# Patient Record
Sex: Female | Born: 1990 | Hispanic: Yes | Marital: Married | Smoking: Never smoker
Health system: Southern US, Community
[De-identification: ages and names within clinical notes are randomized; demographics above are authoritative.]

## PROBLEM LIST (undated history)

## (undated) ENCOUNTER — Emergency Department (HOSPITAL_COMMUNITY): Payer: BLUE CROSS/BLUE SHIELD

## (undated) DIAGNOSIS — Z789 Other specified health status: Secondary | ICD-10-CM

## (undated) HISTORY — PX: NO PAST SURGERIES: SHX2092

---

## 2021-05-23 ENCOUNTER — Encounter (HOSPITAL_COMMUNITY): Payer: Self-pay | Admitting: Obstetrics & Gynecology

## 2021-05-23 ENCOUNTER — Emergency Department (HOSPITAL_COMMUNITY): Payer: BLUE CROSS/BLUE SHIELD

## 2021-05-23 ENCOUNTER — Emergency Department (HOSPITAL_COMMUNITY): Payer: BLUE CROSS/BLUE SHIELD | Admitting: Certified Registered Nurse Anesthetist

## 2021-05-23 ENCOUNTER — Other Ambulatory Visit: Payer: Self-pay

## 2021-05-23 ENCOUNTER — Encounter (HOSPITAL_COMMUNITY): Admission: EM | Disposition: A | Discharge status: Home / Self Care | Payer: Self-pay | Source: Home / Self Care

## 2021-05-23 ENCOUNTER — Ambulatory Visit (HOSPITAL_COMMUNITY)
Admission: EM | Admit: 2021-05-23 | Discharge: 2021-05-23 | Disposition: A | Discharge status: Home / Self Care | Payer: BLUE CROSS/BLUE SHIELD | Attending: Obstetrics & Gynecology | Admitting: Obstetrics & Gynecology

## 2021-05-23 DIAGNOSIS — K661 Hemoperitoneum: Secondary | ICD-10-CM | POA: Diagnosis not present

## 2021-05-23 DIAGNOSIS — Z3A01 Less than 8 weeks gestation of pregnancy: Secondary | ICD-10-CM

## 2021-05-23 DIAGNOSIS — O00101 Right tubal pregnancy without intrauterine pregnancy: Secondary | ICD-10-CM | POA: Insufficient documentation

## 2021-05-23 HISTORY — DX: Other specified health status: Z78.9

## 2021-05-23 HISTORY — PX: LAPAROSCOPIC UNILATERAL SALPINGECTOMY: SHX5934

## 2021-05-23 LAB — URINALYSIS, ROUTINE W REFLEX MICROSCOPIC
Bilirubin Urine: NEGATIVE
Glucose, UA: NEGATIVE mg/dL
Ketones, ur: NEGATIVE mg/dL
Nitrite: NEGATIVE
Protein, ur: 100 mg/dL — AB
Specific Gravity, Urine: 1.024 (ref 1.005–1.030)
pH: 5 (ref 5.0–8.0)

## 2021-05-23 LAB — COMPREHENSIVE METABOLIC PANEL
ALT: 13 U/L (ref 0–44)
AST: 17 U/L (ref 15–41)
Albumin: 4.4 g/dL (ref 3.5–5.0)
Alkaline Phosphatase: 63 U/L (ref 38–126)
Anion gap: 9 (ref 5–15)
BUN: 13 mg/dL (ref 6–20)
CO2: 21 mmol/L — ABNORMAL LOW (ref 22–32)
Calcium: 9.8 mg/dL (ref 8.9–10.3)
Chloride: 107 mmol/L (ref 98–111)
Creatinine, Ser: 0.68 mg/dL (ref 0.44–1.00)
GFR, Estimated: >60 mL/min (ref >60)
Glucose, Bld: 114 mg/dL — ABNORMAL HIGH (ref 70–99)
Potassium: 4 mmol/L (ref 3.5–5.1)
Sodium: 137 mmol/L (ref 135–145)
Total Bilirubin: 0.8 mg/dL (ref 0.3–1.2)
Total Protein: 8.4 g/dL — ABNORMAL HIGH (ref 6.5–8.1)

## 2021-05-23 LAB — WET PREP, GENITAL
Clue Cells Wet Prep HPF POC: NONE SEEN
Sperm: NONE SEEN
Trich, Wet Prep: NONE SEEN
WBC, Wet Prep HPF POC: <10 (ref <10)
Yeast Wet Prep HPF POC: NONE SEEN

## 2021-05-23 LAB — URINALYSIS, MICROSCOPIC (REFLEX)
RBC / HPF: >50 RBC/hpf (ref 0–5)
WBC, UA: >50 WBC/hpf (ref 0–5)

## 2021-05-23 LAB — CBC
HCT: 39.7 % (ref 36.0–46.0)
Hemoglobin: 13.1 g/dL (ref 12.0–15.0)
MCH: 30.6 pg (ref 26.0–34.0)
MCHC: 33 g/dL (ref 30.0–36.0)
MCV: 92.8 fL (ref 80.0–100.0)
Platelets: 360 10*3/uL (ref 150–400)
RBC: 4.28 MIL/uL (ref 3.87–5.11)
RDW: 13.6 % (ref 11.5–15.5)
WBC: 11.3 10*3/uL — ABNORMAL HIGH (ref 4.0–10.5)
nRBC: 0 % (ref 0.0–0.2)

## 2021-05-23 LAB — POCT PREGNANCY, URINE: Preg Test, Ur: POSITIVE — AB

## 2021-05-23 LAB — HCG, QUANTITATIVE, PREGNANCY: hCG, Beta Chain, Quant, S: 6603 m[IU]/mL — ABNORMAL HIGH (ref <5)

## 2021-05-23 SURGERY — SALPINGECTOMY, UNILATERAL, LAPAROSCOPIC
Anesthesia: General | Laterality: Right

## 2021-05-23 MED ORDER — LIDOCAINE 2% (20 MG/ML) 5 ML SYRINGE
INTRAMUSCULAR | Status: AC
  Filled 2021-05-23: qty 5

## 2021-05-23 MED ORDER — ROCURONIUM BROMIDE 100 MG/10ML IV SOLN
INTRAVENOUS | Status: DC | PRN
  Administered 2021-05-23: 50 mg via INTRAVENOUS
  Administered 2021-05-23: 20 mg via INTRAVENOUS

## 2021-05-23 MED ORDER — LACTATED RINGERS IV SOLN: INTRAVENOUS | Status: DC | PRN

## 2021-05-23 MED ORDER — DEXAMETHASONE SODIUM PHOSPHATE 10 MG/ML IJ SOLN
INTRAMUSCULAR | Status: DC | PRN
  Administered 2021-05-23: 10 mg via INTRAVENOUS

## 2021-05-23 MED ORDER — SODIUM CHLORIDE 0.9 % IR SOLN
Status: DC | PRN
  Administered 2021-05-23: 3000 mL

## 2021-05-23 MED ORDER — PROPOFOL 10 MG/ML IV BOLUS
INTRAVENOUS | Status: DC | PRN
  Administered 2021-05-23: 150 mg via INTRAVENOUS

## 2021-05-23 MED ORDER — SUGAMMADEX SODIUM 200 MG/2ML IV SOLN
INTRAVENOUS | Status: DC | PRN
Start: 2021-05-23 — End: 2021-05-23
  Administered 2021-05-23: 200 mg via INTRAVENOUS

## 2021-05-23 MED ORDER — FENTANYL CITRATE (PF) 250 MCG/5ML IJ SOLN
INTRAMUSCULAR | Status: AC
  Filled 2021-05-23: qty 5

## 2021-05-23 MED ORDER — SUCCINYLCHOLINE CHLORIDE 200 MG/10ML IV SOSY
PREFILLED_SYRINGE | INTRAVENOUS | Status: AC
  Filled 2021-05-23: qty 10

## 2021-05-23 MED ORDER — PROPOFOL 10 MG/ML IV BOLUS
INTRAVENOUS | Status: AC
  Filled 2021-05-23: qty 20

## 2021-05-23 MED ORDER — PHENYLEPHRINE HCL (PRESSORS) 10 MG/ML IV SOLN
INTRAVENOUS | Status: DC | PRN
  Administered 2021-05-23 (×2): 80 ug via INTRAVENOUS

## 2021-05-23 MED ORDER — OXYCODONE-ACETAMINOPHEN 5-325 MG PO TABS: 1.0000 | ORAL_TABLET | Freq: Four times a day (QID) | ORAL | 0 refills | Status: DC | PRN

## 2021-05-23 MED ORDER — FENTANYL CITRATE (PF) 100 MCG/2ML IJ SOLN
INTRAMUSCULAR | Status: DC | PRN
  Administered 2021-05-23: 50 ug via INTRAVENOUS
  Administered 2021-05-23: 100 ug via INTRAVENOUS
  Administered 2021-05-23: 50 ug via INTRAVENOUS

## 2021-05-23 MED ORDER — ONDANSETRON HCL 4 MG/2ML IJ SOLN
INTRAMUSCULAR | Status: AC
  Filled 2021-05-23: qty 2

## 2021-05-23 MED ORDER — DEXAMETHASONE SODIUM PHOSPHATE 10 MG/ML IJ SOLN
INTRAMUSCULAR | Status: AC
  Filled 2021-05-23: qty 1

## 2021-05-23 MED ORDER — BUPIVACAINE HCL (PF) 0.25 % IJ SOLN
INTRAMUSCULAR | Status: AC
Start: 2021-05-23 — End: ?
  Filled 2021-05-23: qty 30

## 2021-05-23 MED ORDER — FENTANYL CITRATE (PF) 100 MCG/2ML IJ SOLN
25.0000 ug | INTRAMUSCULAR | Status: DC | PRN
  Administered 2021-05-23: 25 ug via INTRAVENOUS

## 2021-05-23 MED ORDER — MIDAZOLAM HCL 2 MG/2ML IJ SOLN
INTRAMUSCULAR | Status: AC
  Filled 2021-05-23: qty 2

## 2021-05-23 MED ORDER — ONDANSETRON HCL 4 MG/2ML IJ SOLN
INTRAMUSCULAR | Status: DC | PRN
  Administered 2021-05-23: 4 mg via INTRAVENOUS

## 2021-05-23 MED ORDER — FENTANYL CITRATE (PF) 100 MCG/2ML IJ SOLN
INTRAMUSCULAR | Status: AC
  Filled 2021-05-23: qty 2

## 2021-05-23 MED ORDER — SUCCINYLCHOLINE CHLORIDE 200 MG/10ML IV SOSY
PREFILLED_SYRINGE | INTRAVENOUS | Status: DC | PRN
  Administered 2021-05-23: 140 mg via INTRAVENOUS

## 2021-05-23 MED ORDER — ROCURONIUM BROMIDE 10 MG/ML (PF) SYRINGE
PREFILLED_SYRINGE | INTRAVENOUS | Status: AC
  Filled 2021-05-23: qty 10

## 2021-05-23 MED ORDER — HYDROMORPHONE HCL 1 MG/ML IJ SOLN
1.0000 mg | INTRAMUSCULAR | Status: DC | PRN
  Administered 2021-05-23: 1 mg via INTRAVENOUS
  Filled 2021-05-23: qty 1

## 2021-05-23 MED ORDER — MIDAZOLAM HCL 5 MG/5ML IJ SOLN
INTRAMUSCULAR | Status: DC | PRN
  Administered 2021-05-23: 2 mg via INTRAVENOUS

## 2021-05-23 MED ORDER — LIDOCAINE HCL (CARDIAC) PF 100 MG/5ML IV SOSY
PREFILLED_SYRINGE | INTRAVENOUS | Status: DC | PRN
Start: 2021-05-23 — End: 2021-05-23
  Administered 2021-05-23: 80 mg via INTRAVENOUS

## 2021-05-23 MED ORDER — BUPIVACAINE HCL (PF) 0.25 % IJ SOLN
INTRAMUSCULAR | Status: DC | PRN
  Administered 2021-05-23: 13 mL

## 2021-05-23 MED ORDER — IBUPROFEN 600 MG PO TABS: 600.0000 mg | ORAL_TABLET | Freq: Four times a day (QID) | ORAL | 3 refills | Status: DC | PRN

## 2021-05-23 SURGICAL SUPPLY — 32 items
CABLE HIGH FREQUENCY MONO STRZ (ELECTRODE) IMPLANT
DERMABOND ADVANCED (GAUZE/BANDAGES/DRESSINGS) ×1
DERMABOND ADVANCED .7 DNX12 (GAUZE/BANDAGES/DRESSINGS) ×1 IMPLANT
DURAPREP 26ML APPLICATOR (WOUND CARE) ×3 IMPLANT
GLOVE BIO SURGEON STRL SZ 6.5 (GLOVE) ×3 IMPLANT
GLOVE BIOGEL PI IND STRL 7.0 (GLOVE) ×8 IMPLANT
GLOVE BIOGEL PI INDICATOR 7.0 (GLOVE) ×4
GOWN STRL REUS W/ TWL LRG LVL3 (GOWN DISPOSABLE) ×4 IMPLANT
GOWN STRL REUS W/TWL LRG LVL3 (GOWN DISPOSABLE) ×2
IRRIG SUCT STRYKERFLOW 2 WTIP (MISCELLANEOUS) ×3
IRRIGATION SUCT STRKRFLW 2 WTP (MISCELLANEOUS) ×1 IMPLANT
KIT TURNOVER KIT B (KITS) ×3 IMPLANT
NDL INSUFFLATION 14GA 120MM (NEEDLE) ×1 IMPLANT
NEEDLE INSUFFLATION 14GA 120MM (NEEDLE) ×3 IMPLANT
NS IRRIG 1000ML POUR BTL (IV SOLUTION) ×3 IMPLANT
PACK LAPAROSCOPY BASIN (CUSTOM PROCEDURE TRAY) ×3 IMPLANT
PACK TRENDGUARD 450 HYBRID PRO (MISCELLANEOUS) ×1 IMPLANT
POUCH SPECIMEN RETRIEVAL 10MM (ENDOMECHANICALS) ×2 IMPLANT
PROTECTOR NERVE ULNAR (MISCELLANEOUS) ×6 IMPLANT
SET TUBE SMOKE EVAC HIGH FLOW (TUBING) ×3 IMPLANT
SHEARS HARMONIC ACE PLUS 36CM (ENDOMECHANICALS) ×2 IMPLANT
SLEEVE ENDOPATH XCEL 5M (ENDOMECHANICALS) ×3 IMPLANT
STRIP CLOSURE SKIN 1/2X4 (GAUZE/BANDAGES/DRESSINGS) IMPLANT
SUT VICRYL 0 UR6 27IN ABS (SUTURE) ×3 IMPLANT
SUT VICRYL 4-0 PS2 18IN ABS (SUTURE) ×3 IMPLANT
TOWEL GREEN STERILE FF (TOWEL DISPOSABLE) ×6 IMPLANT
TRAY FOLEY W/BAG SLVR 14FR (SET/KITS/TRAYS/PACK) ×3 IMPLANT
TRENDGUARD 450 HYBRID PRO PACK (MISCELLANEOUS) ×3
TROCAR XCEL DIL TIP R 11M (ENDOMECHANICALS) ×3 IMPLANT
TROCAR XCEL NON-BLD 11X100MML (ENDOMECHANICALS) ×3 IMPLANT
TROCAR XCEL NON-BLD 5MMX100MML (ENDOMECHANICALS) ×3 IMPLANT
WARMER LAPAROSCOPE (MISCELLANEOUS) ×3 IMPLANT

## 2021-05-23 NOTE — Anesthesia Postprocedure Evaluation (Signed)
Anesthesia Post Note ? ?Patient: Teresa Mckinney ? ?Procedure(s) Performed: LAPAROSCOPIC RIGHT SALPINGECTOMY WITH REMOVAL OF ECTOPIC PREGNANCY (Right) ? ?  ? ?Patient location during evaluation: PACU ?Anesthesia Type: General ?Level of consciousness: awake and alert ?Pain management: pain level controlled ?Vital Signs Assessment: post-procedure vital signs reviewed and stable ?Respiratory status: spontaneous breathing, nonlabored ventilation, respiratory function stable and patient connected to nasal cannula oxygen ?Cardiovascular status: blood pressure returned to baseline and stable ?Postop Assessment: no apparent nausea or vomiting ?Anesthetic complications: no ? ? ?No notable events documented. ? ?Last Vitals:  ?Vitals:  ? 05/23/21 0745 05/23/21 0800  ?BP: 107/67 117/85  ?Pulse: 74 81  ?Resp: 12 14  ?Temp:  (!) 36.4 ?C  ?SpO2: 100% 99%  ?  ?Last Pain:  ?Vitals:  ? 05/23/21 0800  ?TempSrc:   ?PainSc: 6   ? ? ?  ?  ?  ?  ?  ?  ? ?March Rummage Carlotta Telfair ? ? ? ? ?

## 2021-05-23 NOTE — Progress Notes (Signed)
Wasted Fentanyl IV. Witnessed by Chaney Malling, RN. ?

## 2021-05-23 NOTE — Op Note (Signed)
Teresa Mckinney ?PROCEDURE DATE: 05/23/2021 ? ?PREOPERATIVE DIAGNOSIS: Ruptured ectopic pregnancy ?POSTOPERATIVE DIAGNOSIS: Ruptured right fallopian tube ectopic pregnancy ?PROCEDURE: Laparoscopic right salpingectomy and removal of ectopic pregnancy ?SURGEON:  Adam Phenix, MD ?ANESTHESIOLOGIST: No responsible provider has been recorded for the case. Anesthesiologist: Dorris Singh, MD; Nance Pew Nelle Don, DO ?CRNA: Reine Just, CRNA; Dorie Rank, CRNA ? ?INDICATIONS: 31 y.o. G3P2002 at [redacted]w[redacted]d here with the preoperative diagnoses as listed above.  Please refer to preoperative notes for more details. Patient was counseled regarding need for laparoscopic salpingectomy. Risks of surgery including bleeding which may require transfusion or reoperation, infection, injury to bowel or other surrounding organs, need for additional procedures including laparotomy and other postoperative/anesthesia complications were explained to patient.  Written informed consent was obtained. ? ?FINDINGS:  moderate amount of hemoperitoneum estimated to be about 100 ml of blood and clots.  Dilated right fallopian tube containing ectopic gestation. Small normal appearing uterus, normal right fallopian tube, right ovary and left ovary. ? ?ANESTHESIA: General ?INTRAVENOUS FLUIDS: 800 ml ?ESTIMATED BLOOD LOSS: 100 ml ?URINE OUTPUT: 80 ml ?SPECIMENS: Right fallopian tube containing ectopic gestation ?COMPLICATIONS: None immediate ? ?PROCEDURE IN DETAIL:  The patient was taken to the operating room where general anesthesia was administered and was found to be adequate.  She was placed in the dorsal lithotomy position, and was prepped and draped in a sterile manner.  A Foley catheter was inserted into her bladder and attached to constant drainage and a uterine manipulator was then advanced into the uterus .   ? ?After an adequate timeout was performed, attention was turned to the abdomen where an umbilical incision was made with the  scalpel.  The Optiview 11-mm trocar and sleeve were then advanced without difficulty with the laparoscope under direct visualization into the abdomen.  The abdomen was then insufflated with carbon dioxide gas and adequate pneumoperitoneum was obtained.  A survey of the patient's pelvis and abdomen revealed the findings above.  Two 5-mm left lower quadrant ports were then placed under direct visualization.  The  suction irrigator was then used to suction the hemoperitoneum and irrigate the pelvis.  Attention was then turned to the right fallopian tube which was grasped and ligated from the underlying mesosalpinx and uterine attachment using the Harmonic instrument.  Good hemostasis was noted.  The specimen was placed in an EndoCatch bag and removed from the abdomen intact.  The abdomen was desufflated, and all instruments were removed.  The fascial incision of the 10-mm site was reapproximated with a 0 Vicryl simple stitch; and all skin incisions were closed with 4-0 Vicryl and Dermabond. The patient tolerated the procedure well.  All instruments, needles, and sponge counts were correct x 2. The patient was taken to the recovery room in stable condition.  ? ?The patient will be discharged to home as per PACU criteria.  Routine postoperative instructions given.  She was prescribed Percocet and ibuprofen   She will follow up in the clinic in about 2-3 weeks for postoperative evaluation. ? ? ?Adam Phenix, MD ?05/23/2021 ?7:34 AM  ?

## 2021-05-23 NOTE — Anesthesia Preprocedure Evaluation (Signed)
Anesthesia Evaluation  ?Patient identified by MRN, date of birth, ID band ?Patient awake ? ? ? ?Reviewed: ?Allergy & Precautions, NPO status , Patient's Chart, lab work & pertinent test results ? ?Airway ?Mallampati: II ? ?TM Distance: >3 FB ? ? ? ? Dental ?  ?Pulmonary ?neg pulmonary ROS,  ?  ?breath sounds clear to auscultation ? ? ? ? ? ? Cardiovascular ?negative cardio ROS ? ? ?Rhythm:Regular Rate:Normal ? ? ?  ?Neuro/Psych ?  ? GI/Hepatic ?negative GI ROS, Neg liver ROS,   ?Endo/Other  ?negative endocrine ROS ? Renal/GU ?negative Renal ROS  ? ?  ?Musculoskeletal ? ? Abdominal ?  ?Peds ? Hematology ?  ?Anesthesia Other Findings ? ? Reproductive/Obstetrics ?History noted ?Dr. Nyoka Cowden ? ?  ? ? ? ? ? ? ? ? ? ? ? ? ? ?  ?  ? ? ? ? ? ? ? ? ?Anesthesia Physical ?Anesthesia Plan ? ?ASA: 1 and emergent ? ?Anesthesia Plan: General  ? ?Post-op Pain Management:   ? ?Induction:  ? ?PONV Risk Score and Plan: 3 and Ondansetron, Dexamethasone and Midazolam ? ?Airway Management Planned: Oral ETT ? ?Additional Equipment:  ? ?Intra-op Plan:  ? ?Post-operative Plan: Extubation in OR ? ?Informed Consent: I have reviewed the patients History and Physical, chart, labs and discussed the procedure including the risks, benefits and alternatives for the proposed anesthesia with the patient or authorized representative who has indicated his/her understanding and acceptance.  ? ? ? ?Dental advisory given ? ?Plan Discussed with: CRNA and Anesthesiologist ? ?Anesthesia Plan Comments:   ? ? ? ? ? ? ?Anesthesia Quick Evaluation ? ?

## 2021-05-23 NOTE — MAU Provider Note (Signed)
Patient Teresa Mckinney is a 31 y.o.  [id number] ? At [redacted]w[redacted]d here with complaints of abdominal pain that started 5 days ago and now with some spotting that started this evening. She denies NV, dizziness. She reports that this is a desired pregnancy.  ?History  ?  ? ?CSN: [id number] ? ?Arrival date and time: 05/23/21 0208 ? ? Event Date/Time  ? First Provider Initiated Contact with Patient 05/23/21 [phone]   ?  ? ?Chief Complaint  ?Patient presents with  ? Possible Pregnancy  ? Vaginal Bleeding  ? Abdominal Pain  ? ?Possible Pregnancy ?This is a new problem. The current episode started today. Associated symptoms include abdominal pain.  ?Vaginal Bleeding ?The patient's primary symptoms include vaginal bleeding. This is a new problem. The current episode started today. The problem occurs intermittently. The problem has been gradually worsening. Associated symptoms include abdominal pain.  ?Abdominal Pain ?This is a new problem. The current episode started in the past 7 days. The onset quality is gradual. The problem occurs constantly. The problem has been gradually worsening.  ? ?OB History   ? ? Gravida  ?3  ? Para  ?2  ? Term  ?2  ? Preterm  ?   ? AB  ?   ? Living  ?2  ?  ? ? SAB  ?   ? IAB  ?   ? Ectopic  ?   ? Multiple  ?   ? Live Births  ?2  ?   ?  ?  ? ? ?Past Medical History:  ?Diagnosis Date  ? Medical history non-contributory   ? ? ?Past Surgical History:  ?Procedure Laterality Date  ? NO PAST SURGERIES    ? ? ?History reviewed. No pertinent family history. ? ?Social History  ? ?Tobacco Use  ? Smoking status: Never  ? Smokeless tobacco: Never  ?Substance Use Topics  ? Alcohol use: Not Currently  ? Drug use: Never  ? ? ?Allergies: No Known Allergies ? ?No medications prior to admission.  ? ? ?Review of Systems  ?HENT: Negative.    ?Eyes: Negative.   ?Respiratory: Negative.    ?Gastrointestinal:  Positive for abdominal pain.  ?Genitourinary:  Positive for vaginal bleeding.  ?Musculoskeletal: Negative.   ?Skin:  Negative.   ?Neurological: Negative.   ?Psychiatric/Behavioral: Negative.    ?Physical Exam  ? ?Blood pressure 106/76, pulse 77, temperature 98.5 ?F (36.9 ?C), temperature source Oral, resp. rate 15, last menstrual period 04/11/2021, SpO2 100 %. ? ?Physical Exam ?Constitutional:   ?   Appearance: She is well-developed.  ?Pulmonary:  ?   Effort: Pulmonary effort is normal.  ?Abdominal:  ?   General: Abdomen is flat.  ?   Palpations: Abdomen is soft.  ?   Tenderness: There is abdominal tenderness.  ?Genitourinary: ?   Adnexa: Right adnexa normal.  ?   Rectum: Normal.  ?Neurological:  ?   General: No focal deficit present.  ?   Mental Status: She is alert.  ?Psychiatric:     ?   Mood and Affect: Mood normal.  ? ? ?MAU Course  ?Procedures ? ?MDM ?-US shows ruptured ectopic, Dr. Debroah Loop notified and will come to see patient ?-last PO intake was at 7 pm and last water was at midnight ? ?Assessment and Plan  ? ?1. Ruptured right tubal ectopic pregnancy causing hemoperitoneum   ? ?-prep for OR ? ?Charlesetta Garibaldi Skye Plamondon ?05/23/2021, 4:31 AM  ?

## 2021-05-23 NOTE — H&P (Addendum)
Teresa Mckinney is an 31 y.o. female. Reporting vaginal bleeding and abdominal pain. Abdominal pain has been going on for 5 days, vaginal spotting started tonight. She denies nausea, vomiting, diarrhea, constipation, fever, chest pain, SOB.  ? ?She denies any other complications in her medical history; she has had two prior vaginal births at age 31 and age 31.  ? ?Pertinent Gynecological History: ?Previous GYN Procedures: HSG and prior fertility work-up.  ? ?Menstrual History: ?Menarche age:  ?Patient's last menstrual period was 04/11/2021. ?  ? ?Past Medical History:  ?Diagnosis Date  ? Medical history non-contributory   ? ? ?Past Surgical History:  ?Procedure Laterality Date  ? NO PAST SURGERIES    ? ? ?History reviewed. No pertinent family history. ? ?Social History:  reports that she has never smoked. She has never used smokeless tobacco. She reports that she does not currently use alcohol. She reports that she does not use drugs. ? ?Allergies: No Known Allergies ? ?No medications prior to admission.  ? ? ?Review of Systems  ?Constitutional: Negative.   ?HENT: Negative.    ?Respiratory: Negative.    ?Cardiovascular: Negative.   ?Gastrointestinal: Negative.   ?Genitourinary: Negative.   ?Musculoskeletal: Negative.   ?Neurological: Negative.   ?Hematological: Negative.   ?Psychiatric/Behavioral: Negative.    ? ?Blood pressure 106/76, pulse 77, temperature 98.5 ?F (36.9 ?C), temperature source Oral, resp. rate 15, last menstrual period 04/11/2021, SpO2 100 %. ?Physical Exam ?Constitutional:   ?   Appearance: She is well-developed.  ?Cardiovascular:  ?   Rate and Rhythm: Normal rate.  ?Abdominal:  ?   General: Abdomen is flat.  ?   Tenderness: There is no abdominal tenderness.  ?Genitourinary: ?   Vagina: Normal.  ?   Comments: NEFG; dark red blood in the vagina with some small clots, cervix is long, closed, thick.  ?Neurological:  ?   General: No focal deficit present.  ?   Mental Status: She is alert.   ?Psychiatric:     ?   Mood and Affect: Mood normal.  ? ? ?Results for orders placed or performed during the hospital encounter of 05/23/21 (from the past 24 hour(s))  ?Pregnancy, urine POC     Status: Abnormal  ? Collection Time: 05/23/21  3:03 AM  ?Result Value Ref Range  ? Preg Test, Ur POSITIVE (A) NEGATIVE  ?Urinalysis, Routine w reflex microscopic     Status: Abnormal  ? Collection Time: 05/23/21  3:05 AM  ?Result Value Ref Range  ? Color, Urine RED (A) YELLOW  ? APPearance CLOUDY (A) CLEAR  ? Specific Gravity, Urine 1.024 1.005 - 1.030  ? pH 5.0 5.0 - 8.0  ? Glucose, UA NEGATIVE NEGATIVE mg/dL  ? Hgb urine dipstick LARGE (A) NEGATIVE  ? Bilirubin Urine NEGATIVE NEGATIVE  ? Ketones, ur NEGATIVE NEGATIVE mg/dL  ? Protein, ur 100 (A) NEGATIVE mg/dL  ? Nitrite NEGATIVE NEGATIVE  ? Leukocytes,Ua MODERATE (A) NEGATIVE  ?Urinalysis, Microscopic (reflex)     Status: Abnormal  ? Collection Time: 05/23/21  3:05 AM  ?Result Value Ref Range  ? RBC / HPF >50 0 - 5 RBC/hpf  ? WBC, UA >50 0 - 5 WBC/hpf  ? Bacteria, UA RARE (A) NONE SEEN  ? Squamous Epithelial / LPF 21-50 0 - 5  ? Mucus PRESENT   ?Wet prep, genital     Status: None  ? Collection Time: 05/23/21  3:53 AM  ?Result Value Ref Range  ? Yeast Wet Prep HPF POC NONE SEEN  NONE SEEN  ? Trich, Wet Prep NONE SEEN NONE SEEN  ? Clue Cells Wet Prep HPF POC NONE SEEN NONE SEEN  ? WBC, Wet Prep HPF POC <10 <10  ? Sperm NONE SEEN   ?CBC     Status: Abnormal  ? Collection Time: 05/23/21  4:39 AM  ?Result Value Ref Range  ? WBC 11.3 (H) 4.0 - 10.5 K/uL  ? RBC 4.28 3.87 - 5.11 MIL/uL  ? Hemoglobin 13.1 12.0 - 15.0 g/dL  ? HCT 39.7 36.0 - 46.0 %  ? MCV 92.8 80.0 - 100.0 fL  ? MCH 30.6 26.0 - 34.0 pg  ? MCHC 33.0 30.0 - 36.0 g/dL  ? RDW 13.6 11.5 - 15.5 %  ? Platelets 360 150 - 400 K/uL  ? nRBC 0.0 0.0 - 0.2 %  ? ? ?US OB Comp Less 14 Wks ? ?Result Date: 05/23/2021 ?CLINICAL DATA:  First trimester pregnancy pelvic pain and vaginal bleeding. EXAM: OBSTETRIC <14 WK [place] AND TRANSVAGINAL  OB US TECHNIQUE: Both transabdominal and transvaginal ultrasound examinations were performed for complete evaluation of the gestation as well as the maternal uterus, adnexal regions, and pelvic cul-de-sac. Transvaginal technique was performed to assess early pregnancy. COMPARISON:  None Available. FINDINGS: Intrauterine gestational sac: None Extrauterine gestational sac: There is a right adnexal heterogeneous mass consistent with an ectopic pregnancy measuring 3.3 x 3.0 x 2.7 cm and containing an irregular gestational sac with yolk sac and fetal pole well visible. Cardiac motion was observed real-time but a M-mode cursor was not placed over the fetus to obtain a heart rate. The fetal pole measures 5 weeks 5 days +/-3 days. Yolk sac:  Visualized. Embryo:  Visualized. Cardiac Activity: Visualized. Heart Rate: Not obtained. Subchorionic hemorrhage:  N/a. Maternal uterus/adnexae: Uterus is anteverted. The cervix is closed. There is a 1.4 cm hypoechoic subserosal fibroid to the left in the body of the uterus. Both ovaries are visible and unremarkable. There is moderate hemorrhagic free fluid some of which appears at least partially organized. Clotted blood appears to surround the right ovary. IMPRESSION: Ruptured ectopic pregnancy measuring 5 weeks 5 days with cardiac motion detected and moderate free hemorrhage. Results phoned to Luna Kitchens at 4:34 a.m., 05/23/2021, with verbal acknowledgement of findings. Electronically Signed   By: Almira Bar M.D.   On: 05/23/2021 04:47  ? ?US OB Transvaginal ? ?Result Date: 05/23/2021 ?CLINICAL DATA:  First trimester pregnancy pelvic pain and vaginal bleeding. EXAM: OBSTETRIC <14 WK [place] AND TRANSVAGINAL OB US TECHNIQUE: Both transabdominal and transvaginal ultrasound examinations were performed for complete evaluation of the gestation as well as the maternal uterus, adnexal regions, and pelvic cul-de-sac. Transvaginal technique was performed to assess early pregnancy.  COMPARISON:  None Available. FINDINGS: Intrauterine gestational sac: None Extrauterine gestational sac: There is a right adnexal heterogeneous mass consistent with an ectopic pregnancy measuring 3.3 x 3.0 x 2.7 cm and containing an irregular gestational sac with yolk sac and fetal pole well visible. Cardiac motion was observed real-time but a M-mode cursor was not placed over the fetus to obtain a heart rate. The fetal pole measures 5 weeks 5 days +/-3 days. Yolk sac:  Visualized. Embryo:  Visualized. Cardiac Activity: Visualized. Heart Rate: Not obtained. Subchorionic hemorrhage:  N/a. Maternal uterus/adnexae: Uterus is anteverted. The cervix is closed. There is a 1.4 cm hypoechoic subserosal fibroid to the left in the body of the uterus. Both ovaries are visible and unremarkable. There is moderate hemorrhagic free fluid some of which appears at  least partially organized. Clotted blood appears to surround the right ovary. IMPRESSION: Ruptured ectopic pregnancy measuring 5 weeks 5 days with cardiac motion detected and moderate free hemorrhage. Results phoned to Luna Kitchens at 4:34 a.m., 05/23/2021, with verbal acknowledgement of findings. Electronically Signed   By: Almira Bar M.D.   On: 05/23/2021 04:47   ? ?Assessment/Plan: ?-lab work pending ?-Dr. Debroah Loop at bedside at 0500 to discuss consent with patienit ?-OR is notified and prepped ?- ? ?Charlesetta Garibaldi Kooistra ?05/23/2021, 5:18 AM ? ?Patient desires surgical management with laparoscopy and removal of ectopic pregnancy with possible salpingectomy.  The risks of surgery were discussed in detail with the patient including but not limited to: bleeding which may require transfusion or reoperation; infection which may require prolonged hospitalization or re-hospitalization and antibiotic therapy; injury to bowel, bladder, ureters and major vessels or other surrounding organs which may lead to other procedures; formation of adhesions; need for additional  procedures including laparotomy or subsequent procedures secondary to intraoperative injury or abnormal pathology; thromboembolic phenomenon; incisional problems and other postoperative or anesthesia complications.  Pati

## 2021-05-23 NOTE — MAU Provider Note (Signed)
Patient Teresa Mckinney is a 31 y.o.  [id number] ? At [redacted]w[redacted]d here with complaints of abdominal pain that started 5 days ago and now with some spotting that started this evening. She denies NV, dizziness, chest pain, fever, congestion, body aches. She reports that this is a desired pregnancy. She denies any other medical problems. She has not started her OB care yet.  ?History  ?  ? ?CSN: [id number] ? ?Arrival date and time: 05/23/21 0208 ? ? Event Date/Time  ? First Provider Initiated Contact with Patient 05/23/21 [phone]   ?  ? ?Chief Complaint  ?Patient presents with  ? Possible Pregnancy  ? Vaginal Bleeding  ? Abdominal Pain  ? ?Possible Pregnancy ?This is a new problem. The current episode started today. Associated symptoms include abdominal pain.  ?Vaginal Bleeding ?The patient's primary symptoms include vaginal bleeding. This is a new problem. The current episode started today. The problem occurs intermittently. The problem has been gradually worsening. Associated symptoms include abdominal pain.  ?Abdominal Pain ?This is a new problem. The current episode started in the past 7 days. The onset quality is gradual. The problem occurs constantly. The problem has been gradually worsening.  ? ?OB History   ? ? Gravida  ?3  ? Para  ?2  ? Term  ?2  ? Preterm  ?   ? AB  ?   ? Living  ?2  ?  ? ? SAB  ?   ? IAB  ?   ? Ectopic  ?   ? Multiple  ?   ? Live Births  ?2  ?   ?  ?  ? ? ?Past Medical History:  ?Diagnosis Date  ? Medical history non-contributory   ? ? ?Past Surgical History:  ?Procedure Laterality Date  ? NO PAST SURGERIES    ? ? ?History reviewed. No pertinent family history. ? ?Social History  ? ?Tobacco Use  ? Smoking status: Never  ? Smokeless tobacco: Never  ?Substance Use Topics  ? Alcohol use: Not Currently  ? Drug use: Never  ? ? ?Allergies: No Known Allergies ? ?No medications prior to admission.  ? ? ?Review of Systems  ?HENT: Negative.    ?Eyes: Negative.   ?Respiratory: Negative.    ?Gastrointestinal:   Positive for abdominal pain.  ?Genitourinary:  Positive for vaginal bleeding.  ?Musculoskeletal: Negative.   ?Skin: Negative.   ?Neurological: Negative.   ?Psychiatric/Behavioral: Negative.    ?Physical Exam  ? ?Blood pressure 106/76, pulse 77, temperature 98.5 ?F (36.9 ?C), temperature source Oral, resp. rate 15, last menstrual period 04/11/2021, SpO2 100 %. ? ?Physical Exam ?Constitutional:   ?   Appearance: She is well-developed.  ?Pulmonary:  ?   Effort: Pulmonary effort is normal.  ?Abdominal:  ?   General: Abdomen is flat.  ?   Palpations: Abdomen is soft.  ?   Tenderness: There is abdominal tenderness.  ?Genitourinary: ?   Adnexa: Right adnexa normal.  ?   Rectum: Normal.  ?Neurological:  ?   General: No focal deficit present.  ?   Mental Status: She is alert.  ?Psychiatric:     ?   Mood and Affect: Mood normal.  ? ? ?MAU Course  ?Procedures ? ?MDM ?-US shows ruptured ectopic, Dr. Debroah Loop notified and will come to see patient ?-last PO intake was at 7 pm and last water was at midnight ? ?Assessment and Plan  ? ?1. Ruptured right tubal ectopic pregnancy causing hemoperitoneum   ? ?-prep for  OR ? ?Marylene Land ?05/23/2021, 5:15 AM  ?

## 2021-05-23 NOTE — Transfer of Care (Signed)
Immediate Anesthesia Transfer of Care Note ? ?Patient: Teresa Mckinney ? ?Procedure(s) Performed: LAPAROSCOPIC RIGHT SALPINGECTOMY WITH REMOVAL OF ECTOPIC PREGNANCY (Right) ? ?Patient Location: PACU ? ?Anesthesia Type:General ? ?Level of Consciousness: awake, alert  and oriented ? ?Airway & Oxygen Therapy: Patient connected to face mask oxygen ? ?Post-op Assessment: Post -op Vital signs reviewed and stable ? ?Post vital signs: stable ? ?Last Vitals:  ?Vitals Value Taken Time  ?BP 107/71 05/23/21 0730  ?Temp    ?Pulse 73 05/23/21 0731  ?Resp 16 05/23/21 0731  ?SpO2 100 % 05/23/21 0731  ?Vitals shown include unvalidated device data. ? ?Last Pain:  ?Vitals:  ? 05/23/21 0259  ?TempSrc:   ?PainSc: 10-Worst pain ever  ?   ? ?  ? ?Complications: No notable events documented. ?

## 2021-05-23 NOTE — MAU Note (Signed)
.  Teresa Mckinney is a 31 y.o. at Unknown here in MAU reporting: she is about [redacted] weeks pregnant and has been having lower abd pain x 5 days, pain is worsening and tonight she noticed blood when she wiped.  ?LMP: 04/11/2021 ?Onset of complaint: one week ?Pain score: 10/10 ?Vitals:  ? 05/23/21 0257  ?BP: 106/76  ?Pulse: 77  ?Resp: 15  ?Temp: 98.5 ?F (36.9 ?C)  ?SpO2: 100%  ?   ? ?Lab orders placed from triage:  urine ? ?

## 2021-05-23 NOTE — Anesthesia Procedure Notes (Signed)
Procedure Name: Intubation ?Date/Time: 05/23/2021 6:15 AM ?Performed by: Marilouise Densmore T, CRNA ?Pre-anesthesia Checklist: Patient identified, Emergency Drugs available, Suction available and Patient being monitored ?Patient Re-evaluated:Patient Re-evaluated prior to induction ?Oxygen Delivery Method: Circle system utilized ?Preoxygenation: Pre-oxygenation with 100% oxygen ?Induction Type: IV induction, Rapid sequence and Cricoid Pressure applied ?Laryngoscope Size: Sabra Heck and 2 ?Grade View: Grade I ?Tube type: Oral ?Tube size: 7.5 mm ?Number of attempts: 1 ?Airway Equipment and Method: Stylet ?Placement Confirmation: ETT inserted through vocal cords under direct vision, positive ETCO2 and breath sounds checked- equal and bilateral ?Secured at: 21 cm ?Tube secured with: Tape ?Dental Injury: Teeth and Oropharynx as per pre-operative assessment  ? ? ? ? ?

## 2021-05-24 ENCOUNTER — Encounter: Payer: Self-pay | Admitting: Family Medicine

## 2021-05-24 ENCOUNTER — Encounter (HOSPITAL_COMMUNITY): Payer: Self-pay | Admitting: Obstetrics & Gynecology

## 2021-05-24 LAB — GC/CHLAMYDIA PROBE AMP (~~LOC~~) NOT AT ARMC
Chlamydia: NEGATIVE
Comment: NEGATIVE
Comment: NORMAL
Neisseria Gonorrhea: NEGATIVE

## 2021-05-24 LAB — TYPE AND SCREEN
ABO/RH(D): O POS
Antibody Screen: NEGATIVE

## 2021-05-25 LAB — SURGICAL PATHOLOGY

## 2021-06-10 ENCOUNTER — Encounter: Payer: Self-pay | Admitting: *Deleted

## 2021-06-10 ENCOUNTER — Ambulatory Visit (INDEPENDENT_AMBULATORY_CARE_PROVIDER_SITE_OTHER): Payer: BLUE CROSS/BLUE SHIELD | Admitting: Obstetrics & Gynecology

## 2021-06-10 VITALS — BP 107/73 | HR 80

## 2021-06-10 DIAGNOSIS — Z09 Encounter for follow-up examination after completed treatment for conditions other than malignant neoplasm: Secondary | ICD-10-CM

## 2021-06-10 NOTE — Progress Notes (Signed)
   GYNECOLOGY POSTOPERATIVE VISIT  Subjective:     Teresa Mckinney is a 31 y.o. G23P2012 female who presents to the clinic 2 weeks status post laparoscopic right salpingectomy for ruptured right fallopian tube ectopic pregnancy on 05/23/2021. Eating a regular diet with difficulty. Bowel movements are normal. The patient is not having any pain.  The following portions of the patient's history were reviewed and updated as appropriate: allergies, current medications, past family history, past medical history, past social history, past surgical history, and problem list. Last pap smear was about 2-3 years ago, she is unsure.  Review of Systems Pertinent items noted in HPI and remainder of comprehensive ROS otherwise negative.    Objective:    BP 107/73   Pulse 80  General:  alert and no distress  Abdomen: soft, bowel sounds active, non-tender  Incision:   healing well, no drainage, no erythema, no hernia, no seroma, no swelling, no dehiscence, incision well approximated    05/23/2021 Surgical Pathology FALLOPIAN TUBE, RIGHT, WITH ECTOPIC PREGNANCY:  -    Ectopic tubal pregnancy, with evidence of tubal rupture   Assessment:    Doing well postoperatively. Operative findings again reviewed. Pathology report discussed.    Plan:    1. Patient desires pregnancy soon, had issues with conceiving this pregnancy for over three years but this was spontaneous. No fertility evaluation in the past.  Will start taking multivitamins with folic acid supplement/prenatal vitamins, avoid teratogens, use ovulation tracker apps and ovulation prediction kits as needed. However, if they are having troubles with conception after six months of trying, further evaluation with HSG, semen analysis or referral to [facility] specialist would be recommended. Optimization of other health issues recommended. Discussed slightly increased risk of repeat ectopic pregnancy given her history, she was told to let us know once she is  pregnant so she gets proper surveillance.  2. Wound care discussed. 3. Activity restrictions: none 4. Anticipated return to work: not applicable. 5. Follow up soon for annual exam and pap smear.   Jaynie Collins, MD, FACOG Obstetrician & Gynecologist, [facility] for [employer], [facility]

## 2021-07-19 ENCOUNTER — Other Ambulatory Visit (HOSPITAL_COMMUNITY)
Admission: RE | Admit: 2021-07-19 | Discharge: 2021-07-19 | Disposition: A | Discharge status: Home / Self Care | Payer: BLUE CROSS/BLUE SHIELD | Source: Ambulatory Visit | Attending: Obstetrics and Gynecology | Admitting: Obstetrics and Gynecology

## 2021-07-19 ENCOUNTER — Encounter: Payer: Self-pay | Admitting: Obstetrics and Gynecology

## 2021-07-19 ENCOUNTER — Ambulatory Visit (INDEPENDENT_AMBULATORY_CARE_PROVIDER_SITE_OTHER): Payer: BLUE CROSS/BLUE SHIELD | Admitting: Obstetrics and Gynecology

## 2021-07-19 VITALS — BP 116/80 | HR 81 | Ht 65.0 in | Wt 167.6 lb

## 2021-07-19 DIAGNOSIS — Z01419 Encounter for gynecological examination (general) (routine) without abnormal findings: Secondary | ICD-10-CM

## 2021-07-19 DIAGNOSIS — Z124 Encounter for screening for malignant neoplasm of cervix: Secondary | ICD-10-CM | POA: Diagnosis not present

## 2021-07-19 DIAGNOSIS — Z8759 Personal history of other complications of pregnancy, childbirth and the puerperium: Secondary | ICD-10-CM | POA: Diagnosis not present

## 2021-07-19 DIAGNOSIS — N898 Other specified noninflammatory disorders of vagina: Secondary | ICD-10-CM | POA: Insufficient documentation

## 2021-07-19 DIAGNOSIS — Z3189 Encounter for other procreative management: Secondary | ICD-10-CM | POA: Insufficient documentation

## 2021-07-19 DIAGNOSIS — R1013 Epigastric pain: Secondary | ICD-10-CM

## 2021-07-19 NOTE — Progress Notes (Unsigned)
Declined STI testing.

## 2021-07-21 NOTE — Progress Notes (Signed)
Obstetrics and Gynecology New Patient Evaluation  Appointment Date: 07/19/2021  OBGYN Clinic: Center for [facility]   Primary Care Provider: Pcp, No  Referring Provider: No ref. provider found  Chief Complaint:  Chief Complaint  Patient presents with   Gynecologic Exam    History of Present Illness: Teresa Mckinney is a 31 y.o.  [id number] (Patient's last menstrual period was 06/25/2021 (exact date).), seen for the above chief complaint.   Patient has some upper right abdominal/epigastric discomfort about a half hour after eating  She also has questions re: getting pregnant.   Review of Systems: Pertinent items noted in HPI and remainder of comprehensive ROS otherwise negative.    Patient Active Problem List   Diagnosis Date Noted   History of ectopic pregnancy 07/19/2021   Postprandial epigastric pain 07/19/2021   Encounter for fertility planning 07/19/2021    Past Medical History:  Past Medical History:  Diagnosis Date   Medical history non-contributory     Past Surgical History:  Past Surgical History:  Procedure Laterality Date   LAPAROSCOPIC UNILATERAL SALPINGECTOMY Right 05/23/2021   Procedure: LAPAROSCOPIC RIGHT SALPINGECTOMY WITH REMOVAL OF ECTOPIC PREGNANCY;  Surgeon: Adam Phenix, MD;  Location: [facility] OR;  Service: Gynecology;  Laterality: Right;    Past Obstetrical History:  OB History  Gravida Para Term Preterm AB Living  3 2 2   1 2   SAB IAB Ectopic Multiple Live Births      1   2    # Outcome Date GA Lbr Len/2nd Weight Sex Delivery Anes PTL Lv  3 Ectopic 05/23/21 [redacted]w[redacted]d         2 Term 03/03/10    F Vag-Spont   LIV  1 Term 03/05/06    F Vag-Spont   LIV    Past Gynecological History: As per HPI. Periods: qmonth, regular, approx 1wk, not heavy or painful History of Pap Smear(s): unknown She is currently using no method for contraception.   Social History:  Social History   Socioeconomic History   Marital status: Married     Spouse name: Not on file   Number of children: Not on file   Years of education: Not on file   Highest education level: Not on file  Occupational History   Not on file  Tobacco Use   Smoking status: Never   Smokeless tobacco: Never  Substance and Sexual Activity   Alcohol use: Not Currently   Drug use: Never   Sexual activity: Yes  Other Topics Concern   Not on file  Social History Narrative   Not on file   Social Determinants of Health   Financial Resource Strain: Not on file  Food Insecurity: Not on file  Transportation Needs: Not on file  Physical Activity: Not on file  Stress: Not on file  Social Connections: Not on file  Intimate Partner Violence: Not on file    Family History: No family history on file.  Medications: none   Allergies Patient has no known allergies.   Physical Exam:  BP 116/80   Pulse 81   Ht 5\' 5"  (1.651 m)   Wt 167 lb 9.6 oz (76 kg)   LMP 06/25/2021 (Exact Date)   Breastfeeding No   BMI 27.89 kg/m  Body mass index is 27.89 kg/m. General appearance: Well nourished, well developed female in no acute distress.  Neck:  Supple, normal appearance, and no thyromegaly  Cardiovascular: normal s1 and s2.  No murmurs, rubs or gallops. Respiratory:  Clear  to auscultation bilateral. Normal respiratory effort Abdomen: positive bowel sounds and no masses, hernias; diffusely non tender to palpation, non distended Breasts: breasts appear normal, no suspicious masses, no skin or nipple changes or axillary nodes, and normal palpation. Neuro/Psych:  Normal mood and affect.  Skin:  Warm and dry.  Lymphatic:  No inguinal lymphadenopathy.   Pelvic exam: is not limited by body habitus EGBUS: within normal limits Vagina: within normal limits and with no blood but with yellow-white discharge in the vault Cervix: normal appearing cervix without tenderness, discharge or lesions Uterus:  nonenlarged and non tender Adnexa:  normal adnexa and no mass,  fullness, tenderness Rectovaginal: deferred  Laboratory: none  Radiology: none  Assessment: pt stable  Plan: 1. Cervical cancer screening  2. Well woman exam with routine gynecological exam  3. History of ectopic pregnancy I told her if she has a positive pregnancy test in the future to let us know asap as she is high risk for repeat ectopics in the future  4. Vaginal discharge - Cervicovaginal ancillary only( [place])  5. Foul smelling vaginal discharge - Cervicovaginal ancillary only( [place])  6. Postprandial epigastric pain If negative, will need pcp referral - US Abdomen Limited RUQ (LIVER/GB); Future  7. Encounter for fertility planning I d/w her getting fertility tracking app and to do timed intercourse. I also told her that using over the counter ovulation urine strips are useful and if no success after 6 months to let us know and can consider workup. Op note didn't note any abnormalities with other adnexa.  Orders Placed This Encounter  Procedures   US Abdomen Limited RUQ (LIVER/GB)    RTC PRN  Cornelia Copa MD Attending Center for [facility])

## 2021-07-22 LAB — CERVICOVAGINAL ANCILLARY ONLY
Bacterial Vaginitis (gardnerella): NEGATIVE
Candida Glabrata: NEGATIVE
Candida Vaginitis: NEGATIVE
Chlamydia: NEGATIVE
Comment: NEGATIVE
Comment: NEGATIVE
Comment: NEGATIVE
Comment: NEGATIVE
Comment: NEGATIVE
Comment: NORMAL
Neisseria Gonorrhea: NEGATIVE
Trichomonas: NEGATIVE

## 2021-07-28 ENCOUNTER — Telehealth: Payer: Self-pay

## 2021-07-28 ENCOUNTER — Ambulatory Visit: Payer: BLUE CROSS/BLUE SHIELD | Attending: Obstetrics and Gynecology

## 2021-07-28 NOTE — Telephone Encounter (Signed)
Left message for pt to call office back regarding missed ultrasound.

## 2021-08-16 ENCOUNTER — Other Ambulatory Visit (HOSPITAL_COMMUNITY)
Admission: RE | Admit: 2021-08-16 | Discharge: 2021-08-16 | Disposition: A | Discharge status: Home / Self Care | Payer: BLUE CROSS/BLUE SHIELD | Source: Ambulatory Visit | Attending: Obstetrics & Gynecology | Admitting: Obstetrics & Gynecology

## 2021-08-16 ENCOUNTER — Ambulatory Visit (INDEPENDENT_AMBULATORY_CARE_PROVIDER_SITE_OTHER): Payer: BLUE CROSS/BLUE SHIELD

## 2021-08-16 VITALS — BP 121/85 | HR 73

## 2021-08-16 DIAGNOSIS — B9689 Other specified bacterial agents as the cause of diseases classified elsewhere: Secondary | ICD-10-CM

## 2021-08-16 DIAGNOSIS — N898 Other specified noninflammatory disorders of vagina: Secondary | ICD-10-CM

## 2021-08-16 DIAGNOSIS — B3731 Acute candidiasis of vulva and vagina: Secondary | ICD-10-CM

## 2021-08-16 DIAGNOSIS — Z113 Encounter for screening for infections with a predominantly sexual mode of transmission: Secondary | ICD-10-CM

## 2021-08-16 NOTE — Progress Notes (Deleted)
SUBJECTIVE:  31 y.o. female who desires a STI screen. Denies abnormal vaginal discharge, bleeding or significant pelvic pain. No UTI symptoms. Denies history of known exposure to STD.  Patient's last menstrual period was 06/25/2021 (exact date).  OBJECTIVE:  She appears well.   ASSESSMENT:  STI Screen   PLAN:  Pt offered STI blood screening-not indicated GC, chlamydia, and trichomonas probe sent to lab.  Treatment: To be determined once lab results are received.  Pt follow up as needed.

## 2021-08-16 NOTE — Progress Notes (Signed)
SUBJECTIVE:  31 y.o. female who desires a STI screen. Abnormal vaginal discharge white discharge with itching, no bleeding or significant pelvic pain. UTI symptoms; burning during urination. Denies history of known exposure to STD.  Symptoms began on 08/13/21.  Patient's last menstrual period was 07/27/2021 (exact date).  OBJECTIVE:  She appears well. Denies any fevers since 08/13/21 that symptoms began. Pt does report unprotected sexual intercourse in the last 14 days.    ASSESSMENT:  STI Screen   PLAN:  Pt offered STI blood screening-requested GC, chlamydia, and trichomonas probe sent to lab.  Treatment: To be determined once lab results are received.  Pt follow up as needed.    Genene Churn, NT   Patient was assessed and managed by nursing staff during this encounter. I have reviewed the chart and agree with the documentation and plan. I have also made any necessary editorial changes.  Jaynie Collins, MD 08/16/2021 9:14 AM

## 2021-08-17 LAB — CERVICOVAGINAL ANCILLARY ONLY
Bacterial Vaginitis (gardnerella): POSITIVE — AB
Candida Glabrata: NEGATIVE
Candida Vaginitis: POSITIVE — AB
Chlamydia: NEGATIVE
Comment: NEGATIVE
Comment: NEGATIVE
Comment: NEGATIVE
Comment: NEGATIVE
Comment: NEGATIVE
Comment: NORMAL
Neisseria Gonorrhea: NEGATIVE
Trichomonas: NEGATIVE

## 2021-08-17 MED ORDER — FLUCONAZOLE 150 MG PO TABS: 150.0000 mg | ORAL_TABLET | Freq: Once | ORAL | 3 refills | Status: AC

## 2021-08-17 MED ORDER — METRONIDAZOLE 500 MG PO TABS: 500.0000 mg | ORAL_TABLET | Freq: Two times a day (BID) | ORAL | 1 refills | Status: AC

## 2021-08-17 NOTE — Addendum Note (Signed)
Addended by: Jaynie Collins A on: 08/17/2021 01:12 PM   Modules accepted: Orders

## 2021-08-18 ENCOUNTER — Ambulatory Visit: Payer: BLUE CROSS/BLUE SHIELD

## 2021-11-25 ENCOUNTER — Ambulatory Visit: Payer: BLUE CROSS/BLUE SHIELD | Admitting: Obstetrics and Gynecology

## 2022-04-09 ENCOUNTER — Other Ambulatory Visit: Payer: Self-pay

## 2022-04-09 ENCOUNTER — Encounter (HOSPITAL_COMMUNITY): Payer: Self-pay | Admitting: Obstetrics & Gynecology

## 2022-04-09 ENCOUNTER — Inpatient Hospital Stay (HOSPITAL_COMMUNITY): Payer: BLUE CROSS/BLUE SHIELD

## 2022-04-09 ENCOUNTER — Inpatient Hospital Stay (HOSPITAL_COMMUNITY)
Admission: AD | Admit: 2022-04-09 | Discharge: 2022-04-09 | Disposition: A | Discharge status: Home / Self Care | Payer: BLUE CROSS/BLUE SHIELD | Attending: Obstetrics & Gynecology | Admitting: Obstetrics & Gynecology

## 2022-04-09 DIAGNOSIS — Z8759 Personal history of other complications of pregnancy, childbirth and the puerperium: Secondary | ICD-10-CM | POA: Diagnosis not present

## 2022-04-09 DIAGNOSIS — O26891 Other specified pregnancy related conditions, first trimester: Secondary | ICD-10-CM | POA: Insufficient documentation

## 2022-04-09 DIAGNOSIS — Z3A01 Less than 8 weeks gestation of pregnancy: Secondary | ICD-10-CM | POA: Diagnosis not present

## 2022-04-09 DIAGNOSIS — R1030 Lower abdominal pain, unspecified: Secondary | ICD-10-CM | POA: Diagnosis not present

## 2022-04-09 DIAGNOSIS — Z3201 Encounter for pregnancy test, result positive: Secondary | ICD-10-CM | POA: Insufficient documentation

## 2022-04-09 DIAGNOSIS — O3680X0 Pregnancy with inconclusive fetal viability, not applicable or unspecified: Secondary | ICD-10-CM

## 2022-04-09 LAB — URINALYSIS, ROUTINE W REFLEX MICROSCOPIC
Bilirubin Urine: NEGATIVE
Glucose, UA: NEGATIVE mg/dL
Hgb urine dipstick: NEGATIVE
Ketones, ur: NEGATIVE mg/dL
Leukocytes,Ua: NEGATIVE
Nitrite: NEGATIVE
Protein, ur: NEGATIVE mg/dL
Specific Gravity, Urine: 1.008 (ref 1.005–1.030)
pH: 6 (ref 5.0–8.0)

## 2022-04-09 LAB — HCG, QUANTITATIVE, PREGNANCY: hCG, Beta Chain, Quant, S: 1587 m[IU]/mL — ABNORMAL HIGH (ref <5)

## 2022-04-09 LAB — POCT PREGNANCY, URINE: Preg Test, Ur: POSITIVE — AB

## 2022-04-09 LAB — WET PREP, GENITAL
Clue Cells Wet Prep HPF POC: NONE SEEN
Sperm: NONE SEEN
Trich, Wet Prep: NONE SEEN
WBC, Wet Prep HPF POC: >=10 — AB (ref <10)
Yeast Wet Prep HPF POC: NONE SEEN

## 2022-04-09 NOTE — MAU Note (Signed)
Teresa Mckinney is a 32 y.o. at Unknown here in MAU reporting: she has abdominal pain that began on Tuesday, had a +HPT yesterday, and has a Hx of previous ectopic pregnancy and wants to be evaluated.  Denies VB. LMP: 03/08/2022 Onset of complaint: Tuesday Pain score: 8 Vitals:   04/09/22 0954  BP: 104/64  Pulse: 71  Resp: 19  Temp: 98.5 F (36.9 C)  SpO2: 100%     FHT:NA Lab orders placed from triage:   UPT & UA

## 2022-04-09 NOTE — Discharge Instructions (Signed)
Please follow up in the office in 2 days for a follow up lab.

## 2022-04-09 NOTE — MAU Provider Note (Signed)
History    [id number]  Arrival date and time: 04/09/22 [id number]   Abdominal pain, Positive pregnancy test, history of ectopic pregnancy  HPI Teresa Mckinney is a 32 y.o. at [place] 4 weeks and 3 days by LMP with PMHx notable for right ectopic pregnancy about a year ago, who presents for lower abdominal pain and pregnancy location confirmation.  She reports abdominal pain, mostly Crampy.  Started 4 days ago, initially thought it was her menstrual period coming, however she missed her period and had a positive pregnancy test at home yesterday.  Pain has been persistent, no clear aggravating or relieving factors.  She has no urinary symptoms, no abnormal vaginal bleeding or discharge, does not feel ill otherwise. She has a history of ruptured ectopic pregnancy diagnosed in 05/2021, around 6 weeks of gestation.  She had been encouraged to come in for an ultrasound early in any pregnancy subsequently.  --/--/O POS (05/07 0439)  OB History     Gravida  4   Para  2   Term  2   Preterm      AB  1   Living  2      SAB      IAB      Ectopic  1   Multiple      Live Births  2           Past Medical History:  Diagnosis Date   Medical history non-contributory     Past Surgical History:  Procedure Laterality Date   LAPAROSCOPIC UNILATERAL SALPINGECTOMY Right 05/23/2021   Procedure: LAPAROSCOPIC RIGHT SALPINGECTOMY WITH REMOVAL OF ECTOPIC PREGNANCY;  Surgeon: Woodroe Mode, MD;  Location: [place];  Service: Gynecology;  Laterality: Right;    History reviewed. No pertinent family history.  Social History   Socioeconomic History   Marital status: Married    Spouse name: Not on file   Number of children: Not on file   Years of education: Not on file   Highest education level: Not on file  Occupational History   Not on file  Tobacco Use   Smoking status: Never   Smokeless tobacco: Never  Substance and Sexual Activity   Alcohol use: Not Currently   Drug use: Never    Sexual activity: Yes    Partners: Male    Birth control/protection: None  Other Topics Concern   Not on file  Social History Narrative   Not on file   Social Determinants of Health   Financial Resource Strain: Not on file  Food Insecurity: Not on file  Transportation Needs: Not on file  Physical Activity: Not on file  Stress: Not on file  Social Connections: Not on file  Intimate Partner Violence: Not on file    No Known Allergies  No current facility-administered medications on file prior to encounter.   No current outpatient medications on file prior to encounter.     Review of Systems  Constitutional:  Negative for chills and fever.  Eyes:  Negative for blurred vision.  Cardiovascular:  Negative for leg swelling.  Gastrointestinal:  Positive for abdominal pain. Negative for vomiting.  Genitourinary:  Negative for dysuria, frequency and urgency.  Musculoskeletal:  Negative for myalgias.  Skin:  Negative for itching.  Neurological:  Negative for dizziness.   Pertinent positives and negative per HPI, all others reviewed and negative  Physical Exam   BP 104/64 (BP Location: Right Arm)   Pulse 71   Temp 98.5 F (36.9 C) (Oral)  Resp 19   Ht 5\' 3"  (1.6 m)   Wt 77.7 kg   LMP 03/08/2022   SpO2 100%   BMI 30.36 kg/m   Patient Vitals for the past 24 hrs:  BP Temp Temp src Pulse Resp SpO2 Height Weight  04/09/22 0954 104/64 98.5 F (36.9 C) Oral 71 19 100 % -- --  04/09/22 0950 -- -- -- -- -- -- 5\' 3"  (1.6 m) 77.7 kg    Physical Exam Vitals reviewed.  Constitutional:      General: She is not in acute distress.    Appearance: She is well-developed. She is not toxic-appearing.  HENT:     Head: Normocephalic and atraumatic.     Mouth/Throat:     Mouth: Mucous membranes are moist.  Eyes:     Extraocular Movements: Extraocular movements intact.  Cardiovascular:     Rate and Rhythm: Normal rate.  Pulmonary:     Effort: Pulmonary effort is normal. No  respiratory distress.  Abdominal:     General: There is no distension.     Palpations: Abdomen is soft.     Tenderness: There is abdominal tenderness in the suprapubic area.  Skin:    General: Skin is warm and dry.  Neurological:     Mental Status: She is alert and oriented to person, place, and time.  Psychiatric:        Mood and Affect: Mood normal.        Behavior: Behavior normal.     Labs Results for orders placed or performed during the hospital encounter of 04/09/22 (from the past 24 hour(s))  Pregnancy, urine POC     Status: Abnormal   Collection Time: 04/09/22  9:15 AM  Result Value Ref Range   Preg Test, Ur POSITIVE (A) NEGATIVE  Urinalysis, Routine w reflex microscopic -Urine, Clean Catch     Status: Abnormal   Collection Time: 04/09/22 10:11 AM  Result Value Ref Range   Color, Urine STRAW (A) YELLOW   APPearance CLEAR CLEAR   Specific Gravity, Urine 1.008 1.005 - 1.030   pH 6.0 5.0 - 8.0   Glucose, UA NEGATIVE NEGATIVE mg/dL   Hgb urine dipstick NEGATIVE NEGATIVE   Bilirubin Urine NEGATIVE NEGATIVE   Ketones, ur NEGATIVE NEGATIVE mg/dL   Protein, ur NEGATIVE NEGATIVE mg/dL   Nitrite NEGATIVE NEGATIVE   Leukocytes,Ua NEGATIVE NEGATIVE  Wet prep, genital     Status: Abnormal   Collection Time: 04/09/22 11:41 AM  Result Value Ref Range   Yeast Wet Prep HPF POC NONE SEEN NONE SEEN   Trich, Wet Prep NONE SEEN NONE SEEN   Clue Cells Wet Prep HPF POC NONE SEEN NONE SEEN   WBC, Wet Prep HPF POC >=10 (A) <10   Sperm NONE SEEN     Imaging US OB LESS THAN 14 WEEKS WITH OB TRANSVAGINAL  Result Date: 04/09/2022 CLINICAL DATA:  32 year old female with pelvic pain x5 days. Positive pregnancy test yesterday. Estimated gestational age by LMP 4 weeks and 3 days. History of previous ruptured ectopic pregnancy treated with Laparoscopic right salpingectomy last year. EXAM: OBSTETRIC <14 WK [place] AND TRANSVAGINAL OB US TECHNIQUE: Both transabdominal and transvaginal ultrasound  examinations were performed for complete evaluation of the gestation as well as the maternal uterus, adnexal regions, and pelvic cul-de-sac. Transvaginal technique was performed to assess early pregnancy. COMPARISON:  05/23/2021. FINDINGS: Intrauterine gestational sac: Possible early (image 72) Yolk sac:  Not visible Embryo:  Not visible Cardiac Activity: Not applicable MSD: 3.3  mm   5 w   0 d Subchorionic hemorrhage:  None visualized. Maternal uterus/adnexae: Incidental small intramural fibroid measuring 9 mm image 26. Left ovary appears normal measuring 2.8 x 1.8 x 2.0 cm. Right ovary appears normal measuring 4.4 x 2.7 x 2.6 cm with a 2.6 cm anechoic cyst with no vascular elements detected on brief color Doppler (image 34). Trace simple appearing free fluid in the cul-de-sac. IMPRESSION: 1. Cannot exclude ectopic pregnancy, but possible early intrauterine gestational sac - although no yolk sac, fetal pole, or cardiac activity yet visualized. Small simple appearing cyst in the right ovary. Left ovary appears normal. 2. Recommend serial quantitative B-HCG levels and follow-up US in 14 days to confirm and assess viability. This recommendation follows SRU consensus guidelines: Diagnostic Criteria for Nonviable Pregnancy Early in the First Trimester. [place] Med [id number]. Electronically Signed   By: Genevie Ann M.D.   On: 04/09/2022 11:05    MAU Course  Procedures Lab Orders         Wet prep, genital         Urinalysis, Routine w reflex microscopic -Urine, Clean Catch         hCG, quantitative, pregnancy         Beta hCG quant (ref lab)         Pregnancy, urine POC    No orders of the defined types were placed in this encounter.  Imaging Orders         US OB LESS THAN 14 WEEKS WITH OB TRANSVAGINAL         US OB LESS THAN 14 WEEKS WITH OB TRANSVAGINAL     MDM moderate  Assessment and Plan  32 year old G4 P2 at 4 weeks 4 days by LMP, with a history of prior to pregnancy, here for eval of  abdominal pain and to localize pregnancy.  Her pregnancy test is positive, she had an ultrasound scan done that shows a possible early intrauterine gestational sac, however not definitive. No adnexal masses noted.  Findings in keeping with pregnancy of unknown location.  I discussed this findings with patient.  Beta-hCG levels today are >1500, in range for gestational age. Genital smear negative GC/chlamydia pending She will follow-up in 2 days for repeat beta-hCG levels at the [place] office since it is close to her work place. I have scheduled an appt for Mon 3/25.  She will follow up at [facility] for an ultrasound in 2 weeks - msg sent to scheduling team.  Dispo: discharged to home in stable condition.  Allergies as of 04/09/2022   No Known Allergies      Medication List     STOP taking these medications    ibuprofen 600 MG tablet Commonly known as: ADVIL   oxyCODONE-acetaminophen 5-325 MG tablet Commonly known as: PERCOCET/ROXICET       Liliane Channel MD MPH OB Fellow, [place] for [place] 04/09/2022

## 2022-04-11 ENCOUNTER — Other Ambulatory Visit: Payer: Self-pay

## 2022-04-11 ENCOUNTER — Ambulatory Visit (INDEPENDENT_AMBULATORY_CARE_PROVIDER_SITE_OTHER): Payer: BLUE CROSS/BLUE SHIELD

## 2022-04-11 VITALS — BP 109/77 | HR 72 | Ht 63.0 in | Wt 173.1 lb

## 2022-04-11 DIAGNOSIS — Z3A01 Less than 8 weeks gestation of pregnancy: Secondary | ICD-10-CM

## 2022-04-11 DIAGNOSIS — O3680X0 Pregnancy with inconclusive fetal viability, not applicable or unspecified: Secondary | ICD-10-CM

## 2022-04-11 LAB — BETA HCG QUANT (REF LAB): hCG Quant: 2381 m[IU]/mL

## 2022-04-11 LAB — CERVICOVAGINAL ANCILLARY ONLY
Comment: NEGATIVE
Trichomonas: NEGATIVE

## 2022-04-11 NOTE — Progress Notes (Signed)
Pt here today for STAT beta for s/p pregnancy of unknown location.  Pt reports that she is here to see if her levels rise.  Pt denies VB and pain.  Pt advised that we will call by end of day with results and f/u.  Pt verbalized understanding.    Received results of beta 2381.  Reviewed chart with Dr. Ilda Basset who recommends that pt have another stat beta in two days.  Pt notified of providers recommendation.  Pt verbalized understanding and scheduled for 04/13/22 at 0830.    Frances Nickels  04/11/22

## 2022-04-13 ENCOUNTER — Ambulatory Visit (INDEPENDENT_AMBULATORY_CARE_PROVIDER_SITE_OTHER): Payer: BLUE CROSS/BLUE SHIELD | Admitting: General Practice

## 2022-04-13 ENCOUNTER — Other Ambulatory Visit: Payer: Self-pay

## 2022-04-13 VITALS — BP 118/79 | HR 75 | Ht 63.0 in | Wt 172.0 lb

## 2022-04-13 DIAGNOSIS — O3680X0 Pregnancy with inconclusive fetal viability, not applicable or unspecified: Secondary | ICD-10-CM

## 2022-04-13 DIAGNOSIS — Z3A01 Less than 8 weeks gestation of pregnancy: Secondary | ICD-10-CM

## 2022-04-13 LAB — BETA HCG QUANT (REF LAB): hCG Quant: 4934 m[IU]/mL

## 2022-04-13 NOTE — Progress Notes (Signed)
Beta HCG Follow-up Visit  [name] presents to [place] for follow-up beta HCG lab. She was seen in MAU for abdominal pain on 3/23. Patient denies pain or bleeding today. Discussed with patient that we are following beta HCG levels today. Results will be back in approximately 2 hours. Valid contact number for patient confirmed. I will call the patient with results.   Beta HCG results:          3/23         1,587           3/25         2,381           3/27         4,934   Results and patient history reviewed with Dr Ilda Basset, who states bhcg levels are rising appropriately. Patient should have follow up ultrasound 11-14 days from 3/23. Scheduled ultrasound for 4/4 @ 9:30 at [facility]. Patient called and informed of plan for follow-up. Also advised to return to MAU if pain returns or if she begins bleeding. Patient verbalized understanding.  [name] 04/13/2022 8:25 AM

## 2022-04-21 ENCOUNTER — Ambulatory Visit (HOSPITAL_COMMUNITY): Payer: BLUE CROSS/BLUE SHIELD

## 2022-04-23 ENCOUNTER — Inpatient Hospital Stay (HOSPITAL_COMMUNITY)
Admission: AD | Admit: 2022-04-23 | Discharge: 2022-04-23 | Disposition: A | Discharge status: Home / Self Care | Payer: BLUE CROSS/BLUE SHIELD | Attending: Obstetrics and Gynecology | Admitting: Obstetrics and Gynecology

## 2022-04-23 ENCOUNTER — Inpatient Hospital Stay (HOSPITAL_COMMUNITY): Payer: BLUE CROSS/BLUE SHIELD

## 2022-04-23 DIAGNOSIS — O468X1 Other antepartum hemorrhage, first trimester: Secondary | ICD-10-CM | POA: Diagnosis not present

## 2022-04-23 DIAGNOSIS — O418X10 Other specified disorders of amniotic fluid and membranes, first trimester, not applicable or unspecified: Secondary | ICD-10-CM | POA: Diagnosis not present

## 2022-04-23 DIAGNOSIS — Z3A01 Less than 8 weeks gestation of pregnancy: Secondary | ICD-10-CM | POA: Insufficient documentation

## 2022-04-23 DIAGNOSIS — O208 Other hemorrhage in early pregnancy: Secondary | ICD-10-CM | POA: Insufficient documentation

## 2022-04-23 DIAGNOSIS — O09291 Supervision of pregnancy with other poor reproductive or obstetric history, first trimester: Secondary | ICD-10-CM | POA: Insufficient documentation

## 2022-04-23 DIAGNOSIS — Z3491 Encounter for supervision of normal pregnancy, unspecified, first trimester: Secondary | ICD-10-CM

## 2022-04-23 LAB — CBC
HCT: 35.4 % — ABNORMAL LOW (ref 36.0–46.0)
Hemoglobin: 11.4 g/dL — ABNORMAL LOW (ref 12.0–15.0)
MCH: 29.8 pg (ref 26.0–34.0)
MCHC: 32.2 g/dL (ref 30.0–36.0)
MCV: 92.4 fL (ref 80.0–100.0)
Platelets: 341 10*3/uL (ref 150–400)
RBC: 3.83 MIL/uL — ABNORMAL LOW (ref 3.87–5.11)
RDW: 14 % (ref 11.5–15.5)
WBC: 9.4 10*3/uL (ref 4.0–10.5)
nRBC: 0 % (ref 0.0–0.2)

## 2022-04-23 LAB — URINALYSIS, ROUTINE W REFLEX MICROSCOPIC
Bilirubin Urine: NEGATIVE
Glucose, UA: NEGATIVE mg/dL
Hgb urine dipstick: NEGATIVE
Ketones, ur: NEGATIVE mg/dL
Nitrite: NEGATIVE
Protein, ur: NEGATIVE mg/dL
Specific Gravity, Urine: 1.011 (ref 1.005–1.030)
pH: 7 (ref 5.0–8.0)

## 2022-04-23 LAB — HCG, QUANTITATIVE, PREGNANCY: hCG, Beta Chain, Quant, S: 38210 m[IU]/mL — ABNORMAL HIGH (ref <5)

## 2022-04-23 NOTE — MAU Note (Signed)
.  Teresa Mckinney is a 32 y.o. at [redacted]w[redacted]d here in MAU reporting: vaginal bleeding that she only notices when she wipes after using the bathroom. She has noticed the bleeding when wiping two times today. She denies wearing a pad or panti-liner. Last intercourse was last Monday. Denies pain or any other symptoms.   Vaginal Bleeding: Vaginal Bleeding Vag. Bleeding: Scant Last Intercourse (if applicable): 04/18/22 # of Pads Previous Hr: none Vaginal bleeding comment: spotting with wiping  Abnormal discharge/LOF: Membranes Sac Identifier: Sac 1 Membrane Status: Intact and    Fetal Movement: N/A  LMP: Patient's last menstrual period was 03/08/2022. Pain score:    Vitals:   04/23/22 1933  BP: 120/75  Pulse: 83  Resp: 16  Temp: 98 F (36.7 C)  SpO2: 100%      FHT: not indicated    Lab orders placed from triage: Urinalysis

## 2022-04-23 NOTE — Discharge Instructions (Signed)
Return to care  If you have heavier bleeding that soaks through more than 2 pads per hour for an hour or more If you bleed so much that you feel like you might pass out or you do pass out If you have significant abdominal pain that is not improved with Tylenol   

## 2022-04-23 NOTE — MAU Provider Note (Addendum)
History     CSN: [id number]  Arrival date and time: 04/23/22 1017   Event Date/Time   First Provider Initiated Contact with Patient 04/23/22 2043      Chief Complaint  Patient presents with   Vaginal Bleeding   Teresa Mckinney , a  32 y.o. [id number] at [redacted]w[redacted]d presents to MAU with complaints of vaginal spotting that started 3 hours ago. She denies being heavy like a period or passing clots. She states it only when she wipes. She denies abnormal vaginal discharge, or pain with urination. She recently had a SAB and is very worried.   Given patient acuity orders placed from triage.        Vaginal Bleeding The patient's pertinent negatives include no pelvic pain or vaginal discharge. Pertinent negatives include no abdominal pain, back pain, chills, constipation, diarrhea, dysuria, fever, headaches, nausea or vomiting.    OB History     Gravida  4   Para  2   Term  2   Preterm      AB  1   Living  2      SAB      IAB      Ectopic  1   Multiple      Live Births  2           Past Medical History:  Diagnosis Date   Medical history non-contributory     Past Surgical History:  Procedure Laterality Date   LAPAROSCOPIC UNILATERAL SALPINGECTOMY Right 05/23/2021   Procedure: LAPAROSCOPIC RIGHT SALPINGECTOMY WITH REMOVAL OF ECTOPIC PREGNANCY;  Surgeon: Adam Phenix, MD;  Location: [facility] OR;  Service: Gynecology;  Laterality: Right;    No family history on file.  Social History   Tobacco Use   Smoking status: Never   Smokeless tobacco: Never  Substance Use Topics   Alcohol use: Not Currently   Drug use: Never    Allergies: No Known Allergies  Medications Prior to Admission  Medication Sig Dispense Refill Last Dose   Prenatal Vit-Fe Fumarate-FA (MULTIVITAMIN-PRENATAL) 27-0.8 MG TABS tablet Take 1 tablet by mouth daily at 12 noon.       Review of Systems  Constitutional:  Negative for chills, fatigue and fever.  Eyes:  Negative for pain and visual  disturbance.  Respiratory:  Negative for apnea, shortness of breath and wheezing.   Cardiovascular:  Negative for chest pain and palpitations.  Gastrointestinal:  Negative for abdominal pain, constipation, diarrhea, nausea and vomiting.  Genitourinary:  Positive for vaginal bleeding. Negative for difficulty urinating, dysuria, pelvic pain, vaginal discharge and vaginal pain.  Musculoskeletal:  Negative for back pain.  Neurological:  Negative for seizures, weakness and headaches.  Psychiatric/Behavioral:  Negative for suicidal ideas.    Physical Exam   Blood pressure 120/75, pulse 83, temperature 98 F (36.7 C), temperature source Oral, resp. rate 16, height 5\' 3"  (1.6 m), weight 79.5 kg, last menstrual period 03/08/2022, SpO2 100 %.  Physical Exam Vitals and nursing note reviewed.  Constitutional:      General: She is not in acute distress.    Appearance: Normal appearance.  HENT:     Head: Normocephalic.  Pulmonary:     Effort: Pulmonary effort is normal.  Musculoskeletal:     Cervical back: Normal range of motion.  Skin:    General: Skin is warm and dry.     Capillary Refill: Capillary refill takes 2 to 3 seconds.  Neurological:     Mental Status: She is alert and oriented  to person, place, and time.  Psychiatric:        Mood and Affect: Mood normal.     MAU Course  Procedures Orders Placed This Encounter  Procedures   Culture, OB Urine   US OB Transvaginal   Urinalysis, Routine w reflex microscopic -Urine, Clean Catch   CBC   hCG, quantitative, pregnancy   Diet NPO time specified   ABO/Rh   Discharge patient   Results for orders placed or performed during the hospital encounter of 04/23/22 (from the past 24 hour(s))  Urinalysis, Routine w reflex microscopic -Urine, Clean Catch     Status: Abnormal   Collection Time: 04/23/22  7:45 PM  Result Value Ref Range   Color, Urine YELLOW YELLOW   APPearance CLEAR CLEAR   Specific Gravity, Urine 1.011 1.005 - 1.030    pH 7.0 5.0 - 8.0   Glucose, UA NEGATIVE NEGATIVE mg/dL   Hgb urine dipstick NEGATIVE NEGATIVE   Bilirubin Urine NEGATIVE NEGATIVE   Ketones, ur NEGATIVE NEGATIVE mg/dL   Protein, ur NEGATIVE NEGATIVE mg/dL   Nitrite NEGATIVE NEGATIVE   Leukocytes,Ua TRACE (A) NEGATIVE   RBC / HPF 0-5 0 - 5 RBC/hpf   WBC, UA 0-5 0 - 5 WBC/hpf   Bacteria, UA RARE (A) NONE SEEN   Squamous Epithelial / HPF 6-10 0 - 5 /HPF   Mucus PRESENT   CBC     Status: Abnormal   Collection Time: 04/23/22  7:54 PM  Result Value Ref Range   WBC 9.4 4.0 - 10.5 K/uL   RBC 3.83 (L) 3.87 - 5.11 MIL/uL   Hemoglobin 11.4 (L) 12.0 - 15.0 g/dL   HCT 62.6 (L) 94.8 - 54.6 %   MCV 92.4 80.0 - 100.0 fL   MCH 29.8 26.0 - 34.0 pg   MCHC 32.2 30.0 - 36.0 g/dL   RDW 27.0 35.0 - 09.3 %   Platelets 341 150 - 400 K/uL   nRBC 0.0 0.0 - 0.2 %  ABO/Rh     Status: None   Collection Time: 04/23/22  7:54 PM  Result Value Ref Range   ABO/RH(D)      O POS Performed at [facility], [address], [place], [place] [address]   hCG, quantitative, pregnancy     Status: Abnormal   Collection Time: 04/23/22  7:54 PM  Result Value Ref Range   hCG, Beta Chain, Quant, S 38,210 (H) <5 mIU/mL    MDM - UA reflexed to culture.  - CBC normal for pregnancy   - Transfer of care to E. Dalayna Lauter NP at 9:28 PM.  Warrick Parisian Danella Deis) Suzie Portela, MSN, CNM  Center for [facility]  04/23/22 8:49 PM    Assessment and Plan   1. Normal IUP (intrauterine pregnancy) on prenatal ultrasound, first trimester  -Ultrasound shows live IUP consistent with LMP dating  2. Subchorionic hematoma in first trimester, single or unspecified fetus  -Small subchorionic hemorrhage seen on ultrasound. Reviewed bleeding & SAB precautions.  She is RH positive  3. [redacted] weeks gestation of pregnancy    Judeth Horn, NP  04/23/2022 9:29 PM

## 2022-04-24 LAB — ABO/RH: ABO/RH(D): O POS

## 2022-04-25 LAB — CULTURE, OB URINE: Culture: >=100000 — AB

## 2022-09-16 ENCOUNTER — Encounter (HOSPITAL_COMMUNITY): Payer: Self-pay | Admitting: Obstetrics and Gynecology

## 2022-09-16 ENCOUNTER — Inpatient Hospital Stay (HOSPITAL_COMMUNITY)
Admission: AD | Admit: 2022-09-16 | Discharge: 2022-09-17 | Disposition: A | Discharge status: Home / Self Care | Payer: BLUE CROSS/BLUE SHIELD | Attending: Obstetrics and Gynecology | Admitting: Obstetrics and Gynecology

## 2022-09-16 DIAGNOSIS — O26892 Other specified pregnancy related conditions, second trimester: Secondary | ICD-10-CM | POA: Diagnosis present

## 2022-09-16 DIAGNOSIS — R519 Headache, unspecified: Secondary | ICD-10-CM | POA: Insufficient documentation

## 2022-09-16 DIAGNOSIS — Z3A27 27 weeks gestation of pregnancy: Secondary | ICD-10-CM | POA: Insufficient documentation

## 2022-09-16 DIAGNOSIS — O212 Late vomiting of pregnancy: Secondary | ICD-10-CM | POA: Diagnosis not present

## 2022-09-16 LAB — URINALYSIS, ROUTINE W REFLEX MICROSCOPIC
Bilirubin Urine: NEGATIVE
Glucose, UA: NEGATIVE mg/dL
Hgb urine dipstick: NEGATIVE
Ketones, ur: NEGATIVE mg/dL
Leukocytes,Ua: NEGATIVE
Nitrite: NEGATIVE
Protein, ur: NEGATIVE mg/dL
Specific Gravity, Urine: 1.008 (ref 1.005–1.030)
pH: 6 (ref 5.0–8.0)

## 2022-09-16 NOTE — MAU Note (Signed)
..  Teresa Mckinney is a 32 y.o. at [redacted]w[redacted]d here in MAU reporting: headache that began Wednesday The pain is so much that has caused her left ear to "beep" and stinging pain in her eyes.   Took 650 mg of tylenol 2 hours ago  Denies abdominal pain, vaginal bleeding, leaking of fluid.  +FM  Pain score: 10/10 Vitals:   09/16/22 2243  BP: 115/74  Pulse: 77  Resp: 17  Temp: 98.3 F (36.8 C)  SpO2: 99%     FHT:140 Lab orders placed from triage:  UA

## 2022-09-17 DIAGNOSIS — Z3A27 27 weeks gestation of pregnancy: Secondary | ICD-10-CM

## 2022-09-17 DIAGNOSIS — O26892 Other specified pregnancy related conditions, second trimester: Secondary | ICD-10-CM

## 2022-09-17 DIAGNOSIS — R519 Headache, unspecified: Secondary | ICD-10-CM

## 2022-09-17 MED ORDER — DIPHENHYDRAMINE HCL 50 MG/ML IJ SOLN
25.0000 mg | Freq: Once | INTRAMUSCULAR | Status: AC
  Administered 2022-09-17: 25 mg via INTRAVENOUS
  Filled 2022-09-17: qty 1

## 2022-09-17 MED ORDER — METOCLOPRAMIDE HCL 5 MG/ML IJ SOLN
10.0000 mg | Freq: Once | INTRAMUSCULAR | Status: AC
  Administered 2022-09-17: 10 mg via INTRAVENOUS
  Filled 2022-09-17: qty 2

## 2022-09-17 MED ORDER — LACTATED RINGERS IV BOLUS
1000.0000 mL | Freq: Once | INTRAVENOUS | Status: AC
  Administered 2022-09-17: 1000 mL via INTRAVENOUS

## 2022-09-17 NOTE — MAU Provider Note (Signed)
History     CSN: [id number]  Arrival date and time: 09/16/22 2221   Event Date/Time   First Provider Initiated Contact with Patient 09/17/22 0018      No chief complaint on file.   Teresa Mckinney is a 32 y.o. [id number] at [redacted]w[redacted]d who receives care at [facility].  She reports her next appt is Tuesday Sept 3rd. She presents today for headache.  She states the HA started 3 days ago.  She reports the pain is located throughout her head and causes burning in her eyes.  She denies relieving factors, but states it is worse with vomiting.  She rates the HA a 10/10 and denies a history of migraines.    Patient endorses fetal movement. Patient denies vaginal concerns and abdominal cramping or contractions.    OB History     Gravida  4   Para  2   Term  2   Preterm      AB  1   Living  2      SAB      IAB      Ectopic  1   Multiple      Live Births  2           Past Medical History:  Diagnosis Date   Medical history non-contributory     Past Surgical History:  Procedure Laterality Date   LAPAROSCOPIC UNILATERAL SALPINGECTOMY Right 05/23/2021   Procedure: LAPAROSCOPIC RIGHT SALPINGECTOMY WITH REMOVAL OF ECTOPIC PREGNANCY;  Surgeon: Adam Phenix, MD;  Location: [facility];  Service: Gynecology;  Laterality: Right;    History reviewed. No pertinent family history.  Social History   Tobacco Use   Smoking status: Never   Smokeless tobacco: Never  Substance Use Topics   Alcohol use: Not Currently   Drug use: Never    Allergies: No Known Allergies  Medications Prior to Admission  Medication Sig Dispense Refill Last Dose   ondansetron (ZOFRAN-ODT) 4 MG disintegrating tablet Take 4 mg by mouth every 8 (eight) hours as needed for nausea or vomiting.   09/16/2022   Prenatal Vit-Fe Fumarate-FA (MULTIVITAMIN-PRENATAL) 27-0.8 MG TABS tablet Take 1 tablet by mouth daily at 12 noon.   09/16/2022    Review of Systems  Constitutional:  Negative for chills and fever.   Eyes:  Negative for visual disturbance.  Gastrointestinal:  Positive for nausea and vomiting (This morning). Negative for abdominal pain, constipation and diarrhea.  Genitourinary:  Negative for difficulty urinating, dyspareunia, vaginal bleeding and vaginal discharge.  Neurological:  Positive for headaches. Negative for light-headedness.   Physical Exam   Blood pressure 115/74, pulse 77, temperature 98.3 F (36.8 C), temperature source Oral, resp. rate 17, height 5\' 4"  (1.626 m), weight 85.5 kg, last menstrual period 03/08/2022, SpO2 99%.  Physical Exam Vitals reviewed.  Constitutional:      General: She is not in acute distress.    Appearance: Normal appearance.  HENT:     Head: Normocephalic and atraumatic.  Eyes:     General: Gaze aligned appropriately.     Extraocular Movements: Extraocular movements intact.     Right eye: No nystagmus.     Left eye: No nystagmus.     Conjunctiva/sclera: Conjunctivae normal.     Pupils: Pupils are equal, round, and reactive to light.  Cardiovascular:     Rate and Rhythm: Normal rate.  Pulmonary:     Effort: Pulmonary effort is normal. No respiratory distress.  Abdominal:     Palpations: Abdomen is  soft.     Tenderness: There is no abdominal tenderness.  Musculoskeletal:        General: Normal range of motion.     Cervical back: Normal range of motion.  Skin:    General: Skin is warm and dry.  Neurological:     Mental Status: She is alert and oriented to person, place, and time.     Cranial Nerves: Cranial nerves 2-12 are intact.     Fetal Assessment 145 bpm, Mod Var, Variable Decels, +Accels Toco: No ctx graphed  MAU Course   Results for orders placed or performed during the hospital encounter of 09/16/22 (from the past 24 hour(s))  Urinalysis, Routine w reflex microscopic -Urine, Clean Catch     Status: Abnormal   Collection Time: 09/16/22 10:57 PM  Result Value Ref Range   Color, Urine STRAW (A) YELLOW   APPearance HAZY  (A) CLEAR   Specific Gravity, Urine 1.008 1.005 - 1.030   pH 6.0 5.0 - 8.0   Glucose, UA NEGATIVE NEGATIVE mg/dL   Hgb urine dipstick NEGATIVE NEGATIVE   Bilirubin Urine NEGATIVE NEGATIVE   Ketones, ur NEGATIVE NEGATIVE mg/dL   Protein, ur NEGATIVE NEGATIVE mg/dL   Nitrite NEGATIVE NEGATIVE   Leukocytes,Ua NEGATIVE NEGATIVE   No results found.  MDM PE Labs: None EFM Start IV Start IV LR Bolus Antiemetic Antihistamine Assessment and Plan  32 year old [id number]  SIUP at 27.4 weeks Cat I FT Headache  -POC Reviewed -Exam performed and findings discussed. -Start IV and give LR Bolus. -Discussed initial usage of Reglan and benadryl for HA treatment. -Patient agreeable. -EFM reassuring for GA. Variable decels noted, but appropriate considering GA.  -Okay to discontinue monitoring.    Cherre Robins MSN, CNM 09/17/2022, 12:19 AM   Reassessment (1:46 AM) -Patient reports resolution of HA with interventions. -No questions or concerns. -Nurse to give precautions. -Encouraged to call primary office or return to MAU if symptoms worsen or with the onset of new symptoms. -Discharged to home in stable condition.  Cherre Robins MSN, CNM Advanced Practice Provider, Center for [employer]

## 2023-04-04 IMAGING — US US OB COMP LESS 14 WK
1 series · 15 of 28 positions shown · non-contrast
Comparison: None Available.

CLINICAL DATA: First trimester pregnancy pelvic pain and vaginal
bleeding.

EXAM:
OBSTETRIC <14 WK US AND TRANSVAGINAL OB US
TECHNIQUE: Both transabdominal and transvaginal ultrasound examinations were
performed for complete evaluation of the gestation as well as the
maternal uterus, adnexal regions, and pelvic cul-de-sac.
Transvaginal technique was performed to assess early pregnancy.

[Series 1: us ob comp less 14 wk · 46 acquisitions, 15 frames shown]
[im 1/46]
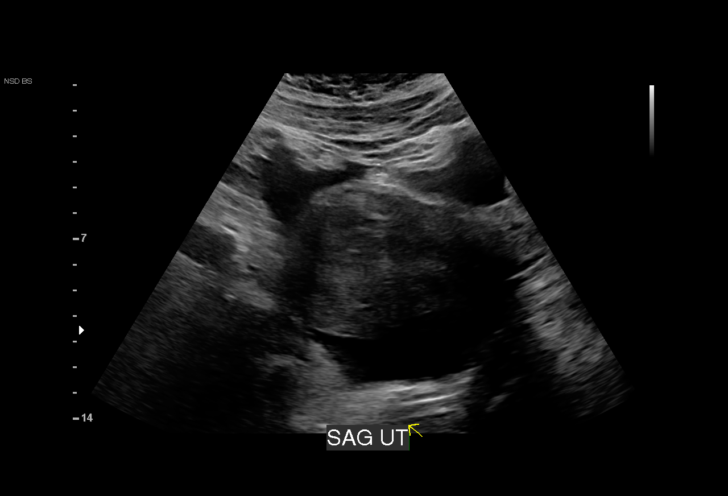
[im 4/46]
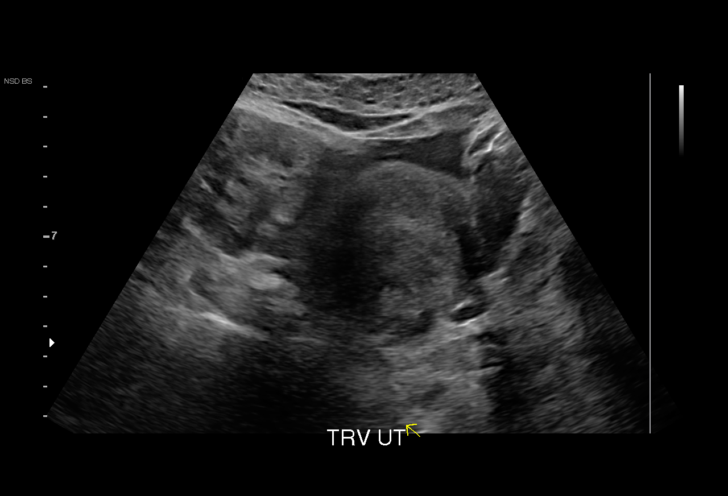
[im 7/46]
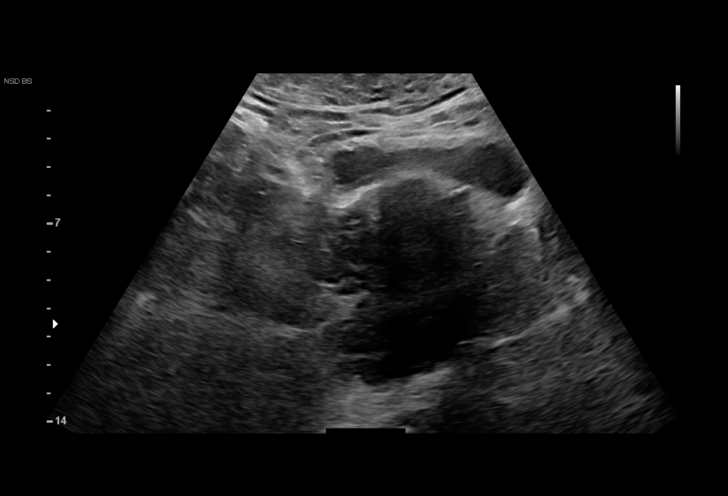
[im 11/46]
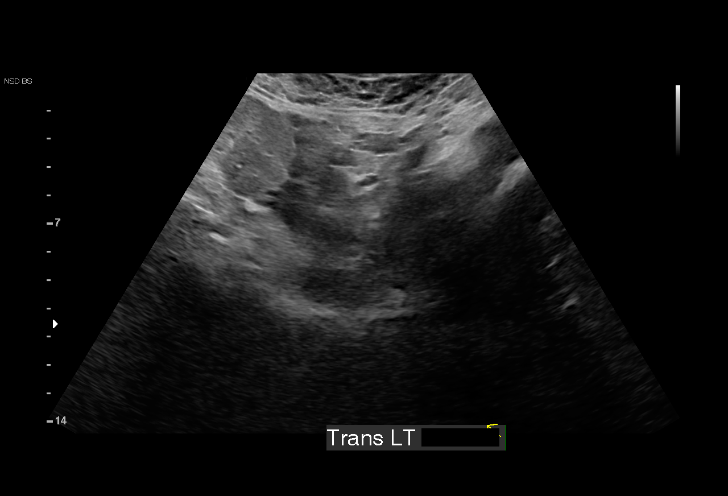
[im 14/46]
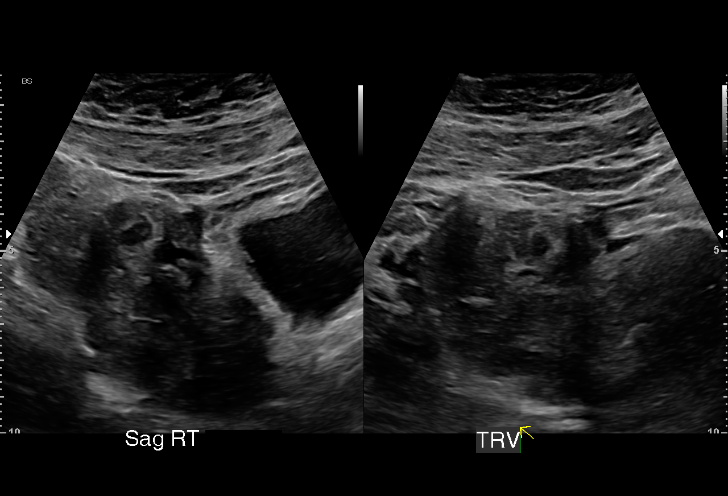
[im 17/46]
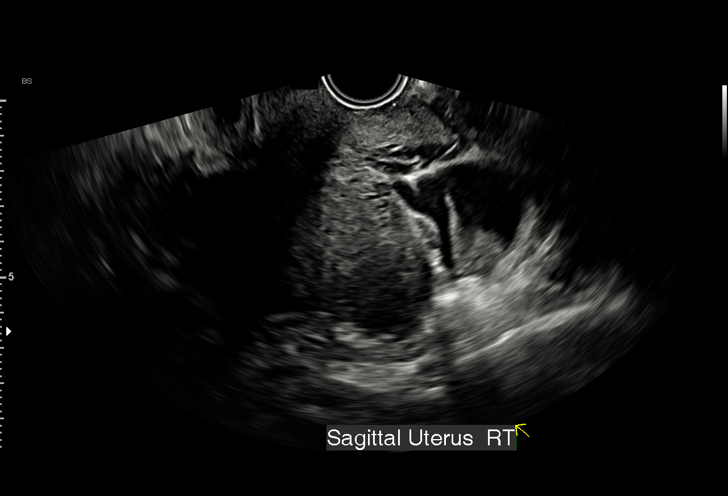
[im 21/46]
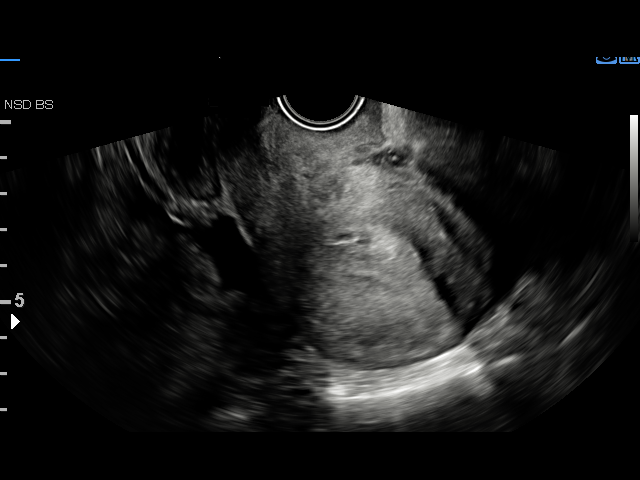
[im 24/46]
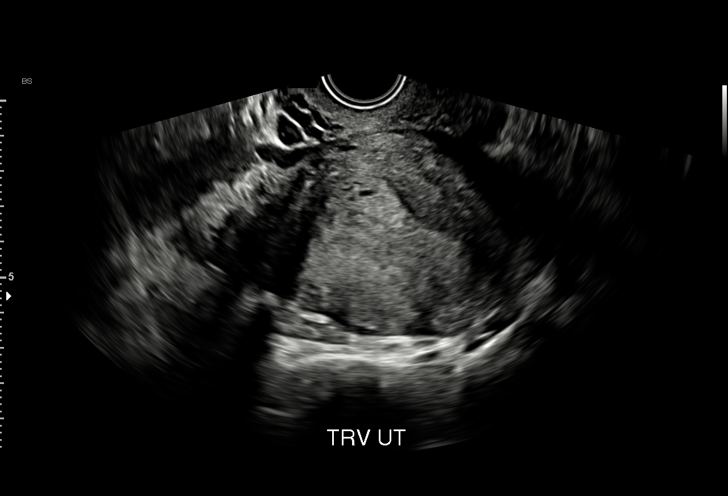
[im 26/46]
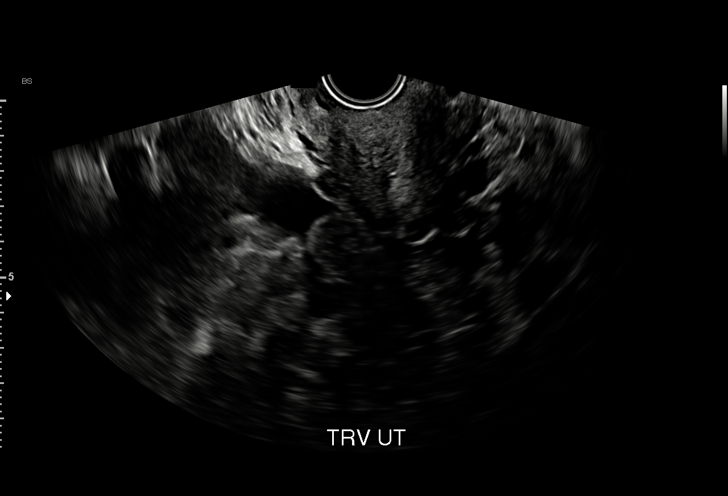
[im 29/46]
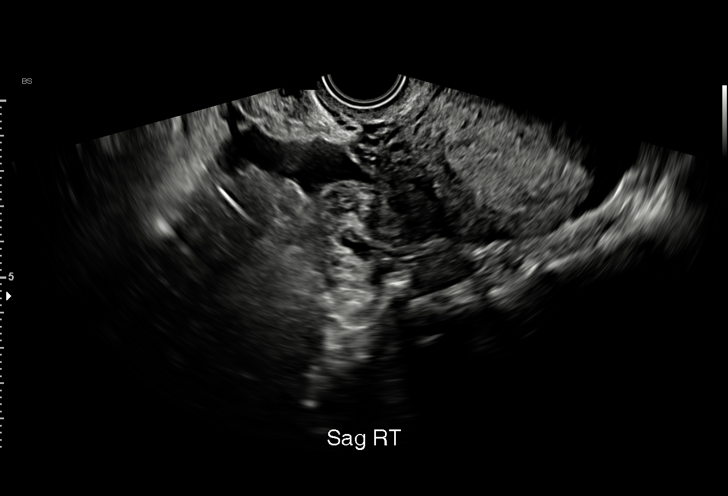
[im 32/46]
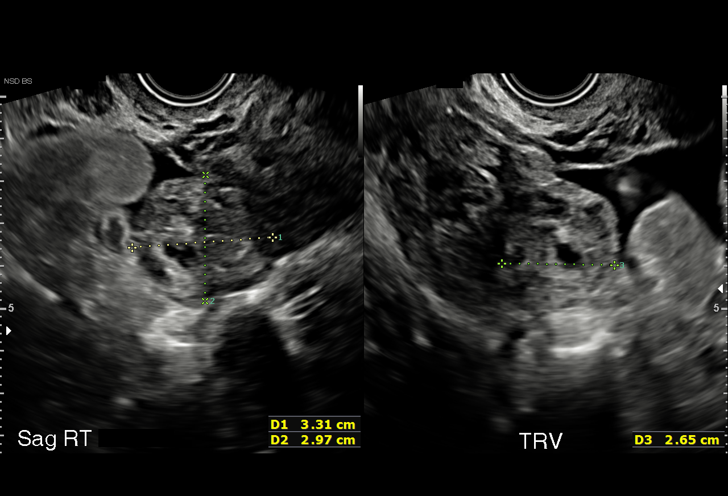
[im 36/46]
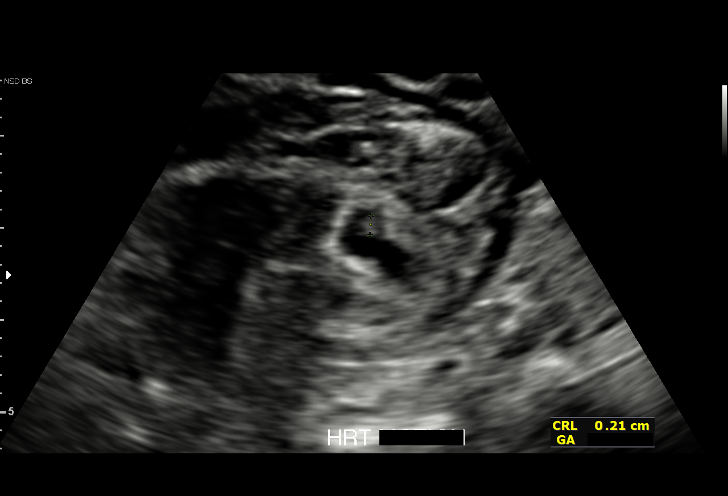
[im 39/46]
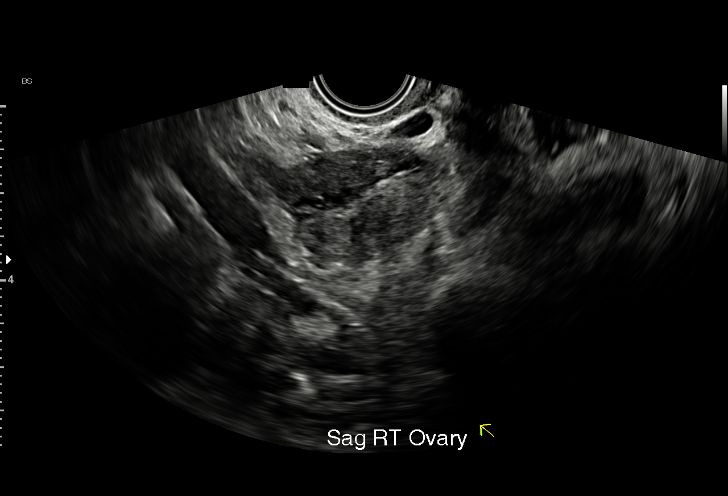
[im 42/46]
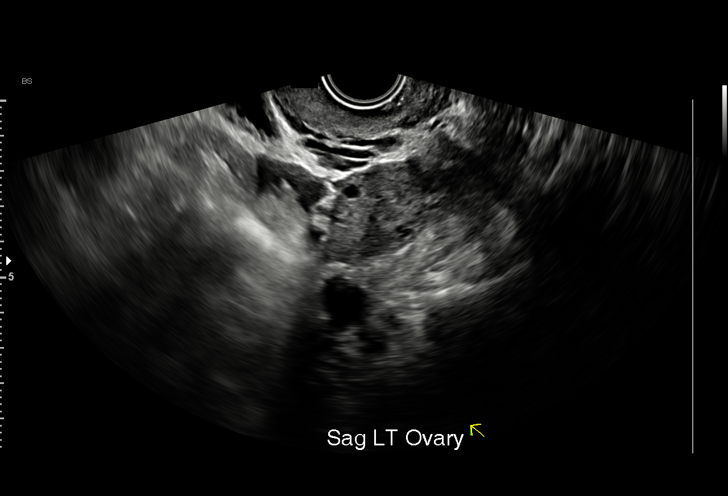
[im 46/46]
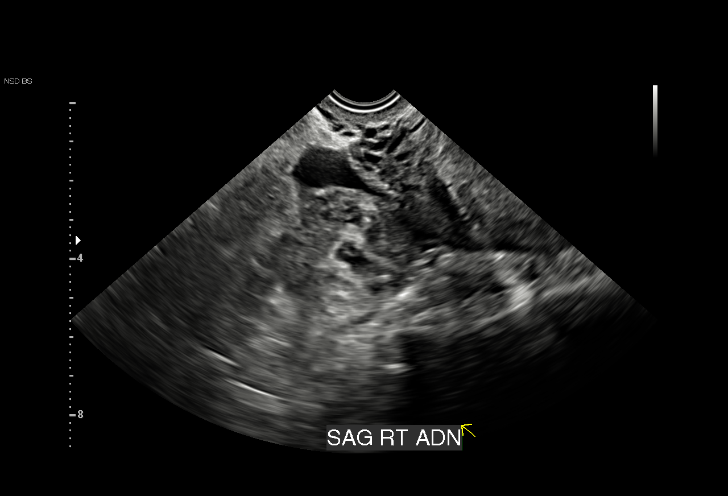

[15 of 28 positions shown; findings below may reference images not displayed]

FINDINGS: Intrauterine gestational sac: None

Extrauterine gestational sac: There is a right adnexal heterogeneous
mass consistent with an ectopic pregnancy measuring 3.3 x 3.0 x
cm and containing an irregular gestational sac with yolk sac and
fetal pole well visible. Cardiac motion was observed real-time but a
M-mode cursor was not placed over the fetus to obtain a heart rate.
The fetal pole measures 5 weeks 5 days +/-3 days.

Yolk sac:  Visualized.

Embryo:  Visualized.

Cardiac Activity: Visualized.

Heart Rate: Not obtained.

Subchorionic hemorrhage:  N/a.

Maternal uterus/adnexae: Uterus is anteverted. The cervix is closed.
There is a 1.4 cm hypoechoic subserosal fibroid to the left in the
body of the uterus. Both ovaries are visible and unremarkable.

There is moderate hemorrhagic free fluid some of which appears at
least partially organized. Clotted blood appears to surround the
right ovary.
IMPRESSION: Ruptured ectopic pregnancy measuring 5 weeks 5 days with cardiac
motion detected and moderate free hemorrhage.

Results phoned to Arissa Billiot at [DATE] a.m., 05/23/2021, with
verbal acknowledgement of findings.

## 2023-05-02 DIAGNOSIS — Z0120 Encounter for dental examination and cleaning without abnormal findings: Secondary | ICD-10-CM | POA: Diagnosis not present

## 2023-06-21 DIAGNOSIS — R300 Dysuria: Secondary | ICD-10-CM | POA: Diagnosis not present

## 2023-06-21 DIAGNOSIS — N898 Other specified noninflammatory disorders of vagina: Secondary | ICD-10-CM | POA: Diagnosis not present

## 2023-11-17 DIAGNOSIS — Z3042 Encounter for surveillance of injectable contraceptive: Secondary | ICD-10-CM | POA: Diagnosis not present

## 2023-12-06 DIAGNOSIS — N814 Uterovaginal prolapse, unspecified: Secondary | ICD-10-CM | POA: Diagnosis not present

## 2023-12-06 DIAGNOSIS — R5383 Other fatigue: Secondary | ICD-10-CM | POA: Diagnosis not present

## 2023-12-06 DIAGNOSIS — R102 Pelvic and perineal pain unspecified side: Secondary | ICD-10-CM | POA: Diagnosis not present

## 2023-12-06 DIAGNOSIS — R300 Dysuria: Secondary | ICD-10-CM | POA: Diagnosis not present
